# Patient Record
Sex: Female | Born: 1980 | Race: White | Hispanic: No | Marital: Married | Smoking: Never smoker
Health system: Southern US, Community
[De-identification: ages and names within clinical notes are randomized; demographics above are authoritative.]

## PROBLEM LIST (undated history)

## (undated) DIAGNOSIS — G35 Multiple sclerosis: Secondary | ICD-10-CM

## (undated) DIAGNOSIS — G35D Multiple sclerosis, unspecified: Secondary | ICD-10-CM

## (undated) DIAGNOSIS — G709 Myoneural disorder, unspecified: Secondary | ICD-10-CM

## (undated) HISTORY — DX: Multiple sclerosis: G35

## (undated) HISTORY — PX: FRACTURE SURGERY: SHX138

## (undated) HISTORY — DX: Multiple sclerosis, unspecified: G35.D

## (undated) HISTORY — PX: DILATION AND CURETTAGE OF UTERUS: SHX78

---

## 2006-06-12 ENCOUNTER — Ambulatory Visit: Payer: Self-pay | Admitting: Unknown Physician Specialty

## 2011-09-26 DIAGNOSIS — J302 Other seasonal allergic rhinitis: Secondary | ICD-10-CM | POA: Insufficient documentation

## 2016-01-03 DIAGNOSIS — M222X2 Patellofemoral disorders, left knee: Secondary | ICD-10-CM

## 2016-01-03 DIAGNOSIS — M222X1 Patellofemoral disorders, right knee: Secondary | ICD-10-CM | POA: Insufficient documentation

## 2016-01-03 DIAGNOSIS — S83232A Complex tear of medial meniscus, current injury, left knee, initial encounter: Secondary | ICD-10-CM | POA: Insufficient documentation

## 2016-01-17 DIAGNOSIS — S83412A Sprain of medial collateral ligament of left knee, initial encounter: Secondary | ICD-10-CM | POA: Insufficient documentation

## 2016-03-30 ENCOUNTER — Emergency Department
Admission: EM | Admit: 2016-03-30 | Discharge: 2016-03-30 | Disposition: A | Discharge status: Home / Self Care | Payer: 59 | Attending: Emergency Medicine | Admitting: Emergency Medicine

## 2016-03-30 DIAGNOSIS — Y9389 Activity, other specified: Secondary | ICD-10-CM | POA: Insufficient documentation

## 2016-03-30 DIAGNOSIS — S61451A Open bite of right hand, initial encounter: Secondary | ICD-10-CM

## 2016-03-30 DIAGNOSIS — Y929 Unspecified place or not applicable: Secondary | ICD-10-CM | POA: Insufficient documentation

## 2016-03-30 DIAGNOSIS — Y999 Unspecified external cause status: Secondary | ICD-10-CM | POA: Diagnosis not present

## 2016-03-30 DIAGNOSIS — W540XXA Bitten by dog, initial encounter: Secondary | ICD-10-CM | POA: Insufficient documentation

## 2016-03-30 MED ORDER — AMOXICILLIN-POT CLAVULANATE 875-125 MG PO TABS: 1.0000 | ORAL_TABLET | Freq: Two times a day (BID) | ORAL | Status: AC

## 2016-03-30 MED ORDER — OXYCODONE-ACETAMINOPHEN 7.5-325 MG PO TABS: 1.0000 | ORAL_TABLET | ORAL | Status: DC | PRN

## 2016-03-30 MED ORDER — AMOXICILLIN-POT CLAVULANATE 875-125 MG PO TABS
1.0000 | ORAL_TABLET | Freq: Once | ORAL | Status: AC
  Administered 2016-03-30: 1 via ORAL
  Filled 2016-03-30: qty 1

## 2016-03-30 MED ORDER — HYDROMORPHONE HCL 1 MG/ML IJ SOLN
1.0000 mg | Freq: Once | INTRAMUSCULAR | Status: AC
  Administered 2016-03-30: 1 mg via INTRAMUSCULAR
  Filled 2016-03-30: qty 1

## 2016-03-30 MED ORDER — IBUPROFEN 600 MG PO TABS
600.0000 mg | ORAL_TABLET | Freq: Once | ORAL | Status: AC
  Administered 2016-03-30: 600 mg via ORAL
  Filled 2016-03-30: qty 1

## 2016-03-30 MED ORDER — IBUPROFEN 600 MG PO TABS: 600.0000 mg | ORAL_TABLET | Freq: Three times a day (TID) | ORAL | Status: DC | PRN

## 2016-03-30 NOTE — Discharge Instructions (Signed)
Human Bite Human bite wounds can get infected very quickly. It is important to get medical treatment. HOME CARE : Wear sling for 2-3 days to decrease swelling. Wound Care  Follow instructions from your doctor about how to take care of your wound. Make sure you:  Wash your hands with soap and water before you change your bandage (dressing). If you cannot use soap and water, use hand sanitizer.  Change your bandage as told by your doctor.  Leave stitches (sutures), skin glue, or skin tape (adhesive) strips in place. They may need to stay in place for 2 weeks or longer. If tape strips get loose and curl up, you may trim the loose edges. Do not remove tape strips completely unless your doctor says it is okay.  Check your wound every day for signs of infection. Watch for:    Redness, swelling, or pain that gets worse.    Fluid, blood, or pus.  General Instructions  Take or apply over-the-counter and prescription medicines only as told by your doctor.  If you were given an antibiotic, take or apply it as told by your doctor. Do not stop using the antibiotic even if your condition improves.  Keep the injured area raised (elevated) above the level of your heart while you are sitting or lying down.  If directed, apply ice to the injured area:    Put ice in a plastic bag.    Place a towel between your skin and the bag.    Leave the ice on for 20 minutes, 2-3 times per day.   Keep all follow-up visits as told by your doctor. This is important.  GET HELP IF:  You have chills.   You have pain when you move your injured area.   You have trouble moving your injured area.   You are not getting better, or you are getting worse.  GET HELP RIGHT AWAY IF:   You have increasing fluid, blood, or pus coming from your wound.   You have increasing redness, swelling, or pain at the site of your wound.   You have a red streak going away from your wound.   You have a  fever.    This information is not intended to replace advice given to you by your health care provider. Make sure you discuss any questions you have with your health care provider.   Document Released: 04/02/2001 Document Revised: 06/28/2015 Document Reviewed: 02/22/2015 Elsevier Interactive Patient Education [employer]

## 2016-03-30 NOTE — ED Provider Notes (Signed)
[facility] Emergency Department Provider Note   ____________________________________________  Time seen: Approximately 9:32 PM  I have reviewed the triage vital signs and the nursing notes.   HISTORY  Chief Complaint Animal Bite    HPI Nancy Jennings is a 35 y.o. female patient with dog bite to the right hand and right shoulder. Incident occurred prior to arrival. Patient was helping a neighbor get her dog untangle in the animal bit her. Patient states she's been assured that the dog's immunizations are up-to-date. Animal control has been notified. Patient denies loss sensation or loss of function of the right upper extremity. Patient rates the pain as 8/10. Patient said her tetanus shot is up-to-date.   No past medical history on file.  There are no active problems to display for this patient.   No past surgical history on file.  Current Outpatient Rx  Name  Route  Sig  Dispense  Refill  . amoxicillin-clavulanate (AUGMENTIN) 875-125 MG tablet   Oral   Take 1 tablet by mouth 2 (two) times daily.   14 tablet   0   . ibuprofen (ADVIL,MOTRIN) 600 MG tablet   Oral   Take 1 tablet (600 mg total) by mouth every 8 (eight) hours as needed.   15 tablet   0   . oxyCODONE-acetaminophen (PERCOCET) 7.5-325 MG tablet   Oral   Take 1 tablet by mouth every 4 (four) hours as needed for severe pain.   20 tablet   0     Allergies Review of patient's allergies indicates no known allergies.  No family history on file.  Social History Social History  Substance Use Topics  . Smoking status: Not on file  . Smokeless tobacco: Not on file  . Alcohol Use: Not on file    Review of Systems Constitutional: No fever/chills Eyes: No visual changes. ENT: No sore throat. Cardiovascular: Denies chest pain. Respiratory: Denies shortness of breath. Gastrointestinal: No abdominal pain.  No nausea, no vomiting.  No diarrhea.  No  constipation. Genitourinary: Negative for dysuria. Musculoskeletal: Negative for back pain. Skin: Negative for rash. Dog bite to the right upper extremity Neurological: Negative for headaches, focal weakness or numbness.   ____________________________________________   PHYSICAL EXAM:  VITAL SIGNS: ED Triage Vitals  Enc Vitals Group     BP 03/30/16 2112 162/103 mmHg     Pulse Rate 03/30/16 2112 116     Resp 03/30/16 2112 24     Temp 03/30/16 2112 98.4 F (36.9 C)     Temp Source 03/30/16 2112 Oral     SpO2 03/30/16 2112 99 %     Weight 03/30/16 2112 285 lb (129.275 kg)     Height 03/30/16 2112 5\' 8"  (1.727 m)     Head Cir --      Peak Flow --      Pain Score 03/30/16 2113 8     Pain Loc --      Pain Edu? --      Excl. in GC? --     Constitutional: Alert and oriented. Well appearing and in no acute distress. Eyes: Conjunctivae are normal. PERRL. EOMI. Head: Atraumatic. Nose: No congestion/rhinnorhea. Mouth/Throat: Mucous membranes are moist.  Oropharynx non-erythematous. Neck: No stridor.  No cervical spine tenderness to palpation. Hematological/Lymphatic/Immunilogical: No cervical lymphadenopathy. Cardiovascular: Normal rate, regular rhythm. Grossly normal heart sounds.  Good peripheral circulation.Elevated blood pressure Respiratory: Normal respiratory effort.  No retractions. Lungs CTAB. Gastrointestinal: Soft and nontender. No distention. No  abdominal bruits. No CVA tenderness. Musculoskeletal: No lower extremity tenderness nor edema.  No joint effusions. Neurologic:  Normal speech and language. No gross focal neurologic deficits are appreciated. No gait instability. Skin:  Skin is warm, dry and intact. No rash noted. Psychiatric: Mood and affect are normal. Speech and behavior are normal.  ____________________________________________   LABS (all labs ordered are listed, but only abnormal results are displayed)  Labs Reviewed - No data to  display ____________________________________________  EKG   ____________________________________________  RADIOLOGY   ____________________________________________   PROCEDURES  Procedure(s) performed: None  Critical Care performed: No  ____________________________________________   INITIAL IMPRESSION / ASSESSMENT AND PLAN / ED COURSE  Pertinent labs & imaging results that were available during my care of the patient were reviewed by me and considered in my medical decision making (see chart for details).  Dog bite right upper extremity. Discussed with patient rationale for not closing the wounds. Patient given discharge care instructions. Patient given prescription for Augmentin, Percocet and ibuprofen. Patient advised with arm sling for 2-3 days to decrease swelling. Patient advised return by ER for condition worsens. ____________________________________________   FINAL CLINICAL IMPRESSION(S) / ED DIAGNOSES  Final diagnoses:  Dog bite of right hand, initial encounter      NEW MEDICATIONS STARTED DURING THIS VISIT:  New Prescriptions   AMOXICILLIN-CLAVULANATE (AUGMENTIN) 875-125 MG TABLET    Take 1 tablet by mouth 2 (two) times daily.   IBUPROFEN (ADVIL,MOTRIN) 600 MG TABLET    Take 1 tablet (600 mg total) by mouth every 8 (eight) hours as needed.   OXYCODONE-ACETAMINOPHEN (PERCOCET) 7.5-325 MG TABLET    Take 1 tablet by mouth every 4 (four) hours as needed for severe pain.     Note:  This document was prepared using Dragon voice recognition software and may include unintentional dictation errors.    Joni Reining, PA-C 03/30/16 2149  Joni Reining, PA-C 03/30/16 1610  Sharyn Creamer, MD 03/31/16 [phone]

## 2016-03-30 NOTE — ED Notes (Signed)
[place] PD Officer Paschal will notify [facility] regarding animal bite.

## 2016-03-30 NOTE — ED Notes (Signed)
Was helping neighbors dog get untangled and it bite her.  Patient with bite to right hand and right upper arm.

## 2018-02-18 HISTORY — PX: FRACTURE SURGERY: SHX138

## 2018-02-22 DIAGNOSIS — S8262XD Displaced fracture of lateral malleolus of left fibula, subsequent encounter for closed fracture with routine healing: Secondary | ICD-10-CM | POA: Insufficient documentation

## 2018-08-20 DIAGNOSIS — M79609 Pain in unspecified limb: Secondary | ICD-10-CM | POA: Insufficient documentation

## 2018-08-25 DIAGNOSIS — M79601 Pain in right arm: Secondary | ICD-10-CM | POA: Insufficient documentation

## 2018-08-25 DIAGNOSIS — M79642 Pain in left hand: Secondary | ICD-10-CM

## 2018-08-25 DIAGNOSIS — M79641 Pain in right hand: Secondary | ICD-10-CM | POA: Insufficient documentation

## 2018-08-25 DIAGNOSIS — M79602 Pain in left arm: Secondary | ICD-10-CM

## 2018-09-30 DIAGNOSIS — R200 Anesthesia of skin: Secondary | ICD-10-CM | POA: Insufficient documentation

## 2018-09-30 DIAGNOSIS — R202 Paresthesia of skin: Secondary | ICD-10-CM

## 2018-10-22 ENCOUNTER — Other Ambulatory Visit: Payer: Self-pay | Admitting: Neurology

## 2018-10-22 DIAGNOSIS — R9089 Other abnormal findings on diagnostic imaging of central nervous system: Secondary | ICD-10-CM

## 2018-10-22 DIAGNOSIS — G35 Multiple sclerosis: Secondary | ICD-10-CM

## 2018-10-28 ENCOUNTER — Ambulatory Visit
Admission: RE | Admit: 2018-10-28 | Discharge: 2018-10-28 | Disposition: A | Discharge status: Home / Self Care | Payer: 59 | Source: Ambulatory Visit | Attending: Neurology | Admitting: Neurology

## 2018-10-28 DIAGNOSIS — G35 Multiple sclerosis: Secondary | ICD-10-CM | POA: Insufficient documentation

## 2018-10-28 DIAGNOSIS — R9089 Other abnormal findings on diagnostic imaging of central nervous system: Secondary | ICD-10-CM | POA: Diagnosis not present

## 2018-10-28 HISTORY — DX: Myoneural disorder, unspecified: G70.9

## 2018-10-28 LAB — CBC WITH DIFFERENTIAL/PLATELET
ABS IMMATURE GRANULOCYTES: 0.01 10*3/uL (ref 0.00–0.07)
BASOS PCT: 1 %
Basophils Absolute: 0 10*3/uL (ref 0.0–0.1)
Eosinophils Absolute: 0.2 10*3/uL (ref 0.0–0.5)
Eosinophils Relative: 3 %
HEMATOCRIT: 44.2 % (ref 36.0–46.0)
HEMOGLOBIN: 13.7 g/dL (ref 12.0–15.0)
Immature Granulocytes: 0 %
Lymphocytes Relative: 29 %
Lymphs Abs: 1.4 10*3/uL (ref 0.7–4.0)
MCH: 25.2 pg — AB (ref 26.0–34.0)
MCHC: 31 g/dL (ref 30.0–36.0)
MCV: 81.4 fL (ref 80.0–100.0)
MONO ABS: 0.4 10*3/uL (ref 0.1–1.0)
MONOS PCT: 8 %
NRBC: 0 % (ref 0.0–0.2)
Neutro Abs: 2.9 10*3/uL (ref 1.7–7.7)
Neutrophils Relative %: 59 %
Platelets: 256 10*3/uL (ref 150–400)
RBC: 5.43 MIL/uL — ABNORMAL HIGH (ref 3.87–5.11)
RDW: 13.9 % (ref 11.5–15.5)
WBC: 4.8 10*3/uL (ref 4.0–10.5)

## 2018-10-28 LAB — CSF CELL COUNT WITH DIFFERENTIAL
Eosinophils, CSF: 0 %
Lymphs, CSF: 90 %
Monocyte-Macrophage-Spinal Fluid: 0 %
OTHER CELLS CSF: 0
RBC COUNT CSF: 322 /mm3 — AB (ref 0–3)
Segmented Neutrophils-CSF: 10 %
TUBE #: 1
WBC, CSF: 2 /mm3 (ref 0–5)

## 2018-10-28 LAB — GLUCOSE, CSF: Glucose, CSF: 59 mg/dL (ref 40–70)

## 2018-10-28 LAB — POCT URINE PREGNANCY: Preg Test, Ur: NEGATIVE

## 2018-10-28 LAB — PROTEIN, CSF: Total  Protein, CSF: 22 mg/dL (ref 15–45)

## 2018-10-28 LAB — POCT PREGNANCY, URINE: PREG TEST UR: NEGATIVE

## 2018-10-28 MED ORDER — ACETAMINOPHEN 500 MG PO TABS
1000.0000 mg | ORAL_TABLET | Freq: Four times a day (QID) | ORAL | Status: DC | PRN
  Administered 2018-10-28: 1000 mg via ORAL
  Filled 2018-10-28 (×2): qty 2

## 2018-10-28 MED ORDER — ACETAMINOPHEN 500 MG PO TABS
ORAL_TABLET | ORAL | Status: AC
  Administered 2018-10-28: 1000 mg via ORAL
  Filled 2018-10-28: qty 2

## 2018-10-28 NOTE — OR Nursing (Signed)
Negative pregnancy test 10/28/2018.

## 2018-10-31 LAB — HSV(HERPES SMPLX VRS)ABS-I+II(IGG)-CSF: HSV Type I/II Ab, IgG CSF: 0.9 IV — ABNORMAL HIGH (ref <=0.89)

## 2018-11-01 LAB — CSF CULTURE: GRAM STAIN: NONE SEEN

## 2018-11-01 LAB — CSF CULTURE W GRAM STAIN: Culture: NO GROWTH

## 2018-11-02 LAB — OLIGOCLONAL BANDS, CSF + SERM

## 2019-01-20 ENCOUNTER — Encounter: Payer: Self-pay | Admitting: Neurology

## 2019-01-26 ENCOUNTER — Other Ambulatory Visit: Payer: Self-pay | Admitting: Hematology and Oncology

## 2019-01-26 ENCOUNTER — Other Ambulatory Visit: Payer: Self-pay

## 2019-01-26 DIAGNOSIS — G35 Multiple sclerosis: Secondary | ICD-10-CM | POA: Insufficient documentation

## 2019-01-26 NOTE — Progress Notes (Signed)
[facility]  [address], [address] [place], [place] [address] Phone: [phone]  Fax: [phone]   Clinic day:  01/27/2019  Chief Complaint: Nancy ButteryJennifer L Jennings is a 38 y.o. female with multiple sclerosis who is referred by Dr. Malvin JohnsPotter for assessment and initiation of Tysabri.  HPI:   The patient was initially seen by Dr Malvin JohnsPotter on 08/18/2018 with bilateral arm pain and tingling.  Symptoms were primarily in the left arm, third and fourth digit and shoulder.  EMG mild left carpal tunnel syndrome.  Lumbar puncture on 10/28/2018 revealed 15 oligoclonal bands.  Cervical spine MRI on 10/30/2018 revealed findings consistent with a demyelinating disease.  Head MRI on 10/30/2018 was consistent with a demyelinating disease.  There were > 10  T2 flair hyperintense white matter lesions in both cerebral hemispheres, both periventricular and juxtacortical areas, as well as in both cerebellar hemispheres.  Laboratory testing on 11/09/2018 was notable for hematocrit of 43.2, hemoglobin 13.7, MCV 81.2, platelets 280,000, white count 5600 with an ANC of 3480.  Creatinine was 0.8.  Liver function tests were normal.  JC virus antibody was 0.26 (0.20-0.40; indeterminate) on 11/09/2018.  Reflex inhibition assay was negative.  Hepatitis C antibody, hepatitis B surface antibody and hepatitis B core antibody total were negative.  QuantiFERON TB gold was negative.  Patient was felt to have moderate multiple sclerosis.  Treatment options considered included Tysabri or Rituxan to control her demyelinating disease.  After completing labs, decision was made to proceed with Tysabri every 6 weeks.   Symptomatically, she notes some lingering pain in her left arm.  She denies any fevers, sweats or weight loss.  She has an appointment with Dr. Malvin JohnsPotter on 02/10/2019.   Past Medical History:  Diagnosis Date  . Neuromuscular disorder [facility](HCC)     Past Surgical History:  Procedure Laterality Date  .  DILATION AND CURETTAGE OF UTERUS    . FRACTURE SURGERY      History reviewed. No pertinent family history.  Social History:  reports that she has never smoked. She has never used smokeless tobacco. She reports current alcohol use of about 1.0 standard drinks of alcohol per week. She reports that she does not use drugs.  No exposure to radiation or toxins.  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  The patient lives in [place].  The patient is alone today.  Allergies: No Known Allergies  Current Medications: Current Outpatient Medications  Medication Sig Dispense Refill  . Cholecalciferol (VITAMIN D3) 50 MCG (2000 UT) capsule Take by mouth.    . gabapentin (NEURONTIN) 100 MG capsule Take 100 mg  1-3 times daily    . Loratadine 10 MG CAPS Take by mouth.    . medroxyPROGESTERone (DEPO-PROVERA) 150 MG/ML injection Inject into the muscle.    . ibuprofen (ADVIL,MOTRIN) 600 MG tablet Take 1 tablet (600 mg total) by mouth every 8 (eight) hours as needed. (Patient not taking: Reported on 01/27/2019) 15 tablet 0  . oxyCODONE-acetaminophen (PERCOCET) 7.5-325 MG tablet Take 1 tablet by mouth every 4 (four) hours as needed for severe pain. (Patient not taking: Reported on 01/27/2019) 20 tablet 0   No current facility-administered medications for this visit.     Review of Systems:  GENERAL:  Feels good.  No fevers, sweats or weight loss. PERFORMANCE STATUS (ECOG):  1 HEENT:  No visual changes, runny nose, sore throat, mouth sores or tenderness. Lungs: No shortness of breath or cough.  No hemoptysis. Cardiac:  No chest pain,  palpitations, orthopnea, or PND. GI:  No nausea, vomiting, diarrhea, constipation, melena or hematochezia. GU:  No urgency, frequency, dysuria, or hematuria. Musculoskeletal:  Lingering pain in left arm.  No back pain.  No joint pain.  No muscle tenderness. Extremities:  No pain or swelling. Skin:  No rashes or skin changes. Neuro:  Sinus related headache.  No headache,  numbness or weakness, balance or coordination issues. Endocrine:  No diabetes, thyroid issues, hot flashes or night sweats. Psych:  No mood changes, depression or anxiety. Pain:  No focal pain. Review of systems:  All other systems reviewed and found to be negative.  Physical Exam: Blood pressure (!) 125/91, pulse 94, temperature 98 F (36.7 C), temperature source Tympanic, resp. rate 18, height  (1.727 m), weight (!) 302 lb 5.8 oz (137.1 kg), SpO2 100 %. GENERAL:  Well developed, well nourished, heavyset woman sitting comfortably in the exam room in no acute distress. MENTAL STATUS:  Alert and oriented to person, place and time. HEAD:  Long dark hair with light brown highlights.  Normocephalic, atraumatic, face symmetric, no Cushingoid features. EYES:  Glasses.  Brown eyes.  Pupils equal round and reactive to light and accomodation.  No conjunctivitis or scleral icterus. ENT:  Oropharynx clear without lesion.  Tongue normal. Mucous membranes moist.  RESPIRATORY:  Clear to auscultation without rales, wheezes or rhonchi. CARDIOVASCULAR:  Regular rate and rhythm without murmur, rub or gallop. ABDOMEN:  Soft, non-tender, with active bowel sounds, and no hepatosplenomegaly.  No masses. SKIN:  No rashes, ulcers or lesions. EXTREMITIES: No edema, no skin discoloration or tenderness.  No palpable cords. LYMPH NODES: No palpable cervical, supraclavicular, axillary or inguinal adenopathy  NEUROLOGICAL: Unremarkable. PSYCH:  Appropriate.   No visits with results within 3 Day(s) from this visit.  Latest known visit with results is:  Hospital Outpatient Visit on 10/28/2018  Component Date Value Ref Range Status  . WBC 10/28/2018 4.8  4.0 - 10.5 K/uL Final  . RBC 10/28/2018 5.43* 3.87 - 5.11 MIL/uL Final  . Hemoglobin 10/28/2018 13.7  12.0 - 15.0 g/dL Final  . HCT 16/07/9603 44.2  36.0 - 46.0 % Final  . MCV 10/28/2018 81.4  80.0 - 100.0 fL Final  . MCH 10/28/2018 25.2* 26.0 - 34.0 pg Final   . MCHC 10/28/2018 31.0  30.0 - 36.0 g/dL Final  . RDW 54/06/8118 13.9  11.5 - 15.5 % Final  . Platelets 10/28/2018 256  150 - 400 K/uL Final  . nRBC 10/28/2018 0.0  0.0 - 0.2 % Final  . Neutrophils Relative % 10/28/2018 59  % Final  . Neutro Abs 10/28/2018 2.9  1.7 - 7.7 K/uL Final  . Lymphocytes Relative 10/28/2018 29  % Final  . Lymphs Abs 10/28/2018 1.4  0.7 - 4.0 K/uL Final  . Monocytes Relative 10/28/2018 8  % Final  . Monocytes Absolute 10/28/2018 0.4  0.1 - 1.0 K/uL Final  . Eosinophils Relative 10/28/2018 3  % Final  . Eosinophils Absolute 10/28/2018 0.2  0.0 - 0.5 K/uL Final  . Basophils Relative 10/28/2018 1  % Final  . Basophils Absolute 10/28/2018 0.0  0.0 - 0.1 K/uL Final  . Immature Granulocytes 10/28/2018 0  % Final  . Abs Immature Granulocytes 10/28/2018 0.01  0.00 - 0.07 K/uL Final   Performed at [facility], [address], [place], [place] [address]  . Preg Test, Ur 10/28/2018 Negative  Negative Final  . Glucose, CSF 10/28/2018 59  40 - 70 mg/dL Final  Performed at [facility], [address], [place], [place] [address]  . Total  Protein, CSF 10/28/2018 22  15 - 45 mg/dL Final   Performed at [facility], [address] [place] [place], [place] [address]  . Tube # 10/28/2018 1   Final  . Color, CSF 10/28/2018 COLORLESS  COLORLESS Final  . Appearance, CSF 10/28/2018 CLEAR* CLEAR Final  . RBC Count, CSF 10/28/2018 322* 0 - 3 /cu mm Final  . WBC, CSF 10/28/2018 2  0 - 5 /cu mm Final  . Segmented Neutrophils-CSF 10/28/2018 10  % Final  . Lymphs, CSF 10/28/2018 90  % Final  . Monocyte-Macrophage-Spinal Fluid 10/28/2018 0  % Final  . Eosinophils, CSF 10/28/2018 0  % Final  . Other Cells, CSF 10/28/2018 0   Final   Performed at [facility], [address] [place], [place] [address]  . Specimen Description 10/28/2018    Final                   Value:CSF Performed at [facility], [address] [place], [place] [address]    . Special Requests 10/28/2018    Final                   Value:NONE Performed at [facility], [address] [place], [place] [address]   . Gram Stain 10/28/2018    Final                   Value:NO ORGANISMS SEEN WBC SEEN RED BLOOD CELLS Performed at [facility], [address] [place], [place] [address]   . Culture 10/28/2018    Final                   Value:NO GROWTH 3 DAYS Performed at [facility], [address], [place], [place] [address]   . Report Status 10/28/2018 11/01/2018 FINAL   Final  . HSV Type I/II Ab, IgG CSF 10/28/2018 0.90* <=0.89 IV Final   Comment: (NOTE) The CSF specimen shows evidence of blood contamination and is, therefore, likely contaminated with serum antibodies. Antibody testing from this specimen is not recommended, as the blood may interfere, causing a false-positive result that does not represent intrathecally produced antibodies. False-negative results are also possible. It is highly recommended that serum testing for the same analyte also be performed in order to aid in interpreting the CSF test result. INTERPRETIVE INFORMATION: Herpes Simplex Virus Type 1 and/or 2                    Antibodies, IgG CSF  0.89 IV or Less .......... Negative: No significant                             level of detectable HSV IgG                             antibody.  0.90 - 1.09 IV ........... Equivocal: Questionable                             presence of IgG antibodies.                             Repeat testing in 10-14 days  may be helpful.  1.10 IV or Greater ....... Positive: IgG antib                          ody to HSV                             detected, which may indicate                             a current or past HSV                             infection. The detection of antibodies to herpes simplex virus in CSF may indicate central nervous system infection. However, consideration must  be given to possible contamination by blood or transfer of serum antibodies across the blood-brain barrier. Fourfold or greater rise in CSF antibodies to herpes on specimens at least 4 weeks apart are found in 74-94 % of patients with herpes encephalitis. Specificity of the test based on a single CSF testing is not established. Presently PCR is the primary means of establishing a diagnosis of herpes encephalitis. Test developed and characteristics determined by [facility]. See Compliance Statement B: [email or web address] Performed At: [facility] [address] [place], [place] [id number] Donivan Scull MD [phone]   . Preg Test, Ur 10/28/2018 NEGATIVE  NEGATIVE Final   Comment:        THE SENSITIVITY OF THIS METHODOLOGY IS >24 mIU/mL   . CSF Oligoclonal Bands 10/28/2018 Comment   Final   Comment: (NOTE) Greater than twelve (>12) oligoclonal bands were observed in the CSF, which were not detected in the serum sample. Specimen was hemolyzed. Result may be adversely affected. Interpretation: Criteria for Positivity: Four (4) or more oligoclonal bands observed  only in the CSF have been shown to be most consistent with MS  using our method. [Fortini AS, Sanders EL, Weinshenker BG, and Katzmann JA: Cerebrospinal Fluid Oligoclonal Bands in the Diagnosis  of Multiple Sclerosis. Am J Clin Pathol 120(5):672-675, 2003]. Oligoclonal bands that are present only in the CSF have been associated with a variety of inflammatory brain diseases such as multiple sclerosis (MS), subacute encephalitis, neurosyphilis, etc.  Increased IgG in the CSF is not specific for MS, but is an indication  of chronic neural inflammation. Clinical correlation indicated. Approximately 2-3% of clinically confirmed MS patients show little or no evidence of oligoclonal bands in the CSF; however olig                          oclonal  bands may develop as the disease progresses. Oligoclonal Banding  testing performed using Isoelectric Focusing (IEF) and immunoblotting methodology. Performed At: [facility] [address] [place], [place] [id number] Jolene Schimke MD VH:[id number]     Assessment:  Nancy Jennings is a 38 y.o. female with multiple sclerosis.  She presented with bilateral arm pain and tingling.  Lumbar puncture on 10/28/2018 revealed 15 oligoclonal bands.  Cervical spine MRI on 10/30/2018 revealed findings consistent with a demyelinating disease.  Head MRI on 10/30/2018 was consistent with a demyelinating disease.  There were > 10  T2 flair hyperintense white matter lesions in both cerebral hemispheres, both periventricular and juxtacortical areas, as well as in both cerebellar hemispheres.  JC  virus antibody was 0.26 (0.20-0.40; indeterminate) on 11/09/2018.  Reflex inhibition assay was negative.  Hepatitis C antibody, hepatitis B surface antibody and hepatitis B core antibody total were negative.  QuantiFERON TB gold was negative.  Symptomatically, she has residual left upper extremity symptoms.  Exam is unremarkable.  Plan: 1.   Review entire medical history, diagnosis and management of multiple sclerosis. 2.   Multiple sclerosis  Discuss plan for Tysabri.  Potential side effects reviewed:     Allergic reaction, increase in LFTs, infection (including herpes), and progressive multifocal leukoencephalopathy (PML).  Discuss testing of JC virus every 6 months with neurology.  Discuss role of the infusion center.  Discuss follow-up with Dr. [name] on 02/10/2019.  Tysabri 300 mg IV today.  Patient consented to treatment. 3.   RTC every 6 weeks for Tysabri. 4.   RTC in 24 weeks for  MD assessment and Fatima Blank, MD, PhD  01/27/2019, 10:42 AM

## 2019-01-27 ENCOUNTER — Inpatient Hospital Stay: Payer: 59

## 2019-01-27 ENCOUNTER — Encounter: Payer: Self-pay | Admitting: Hematology and Oncology

## 2019-01-27 ENCOUNTER — Inpatient Hospital Stay: Payer: 59 | Attending: Hematology and Oncology | Admitting: Hematology and Oncology

## 2019-01-27 VITALS — BP 118/77 | HR 85 | Resp 18

## 2019-01-27 VITALS — BP 125/91 | HR 94 | Temp 98.0°F | Resp 18 | Ht 68.0 in | Wt 302.4 lb

## 2019-01-27 DIAGNOSIS — M79602 Pain in left arm: Secondary | ICD-10-CM | POA: Diagnosis not present

## 2019-01-27 DIAGNOSIS — G35 Multiple sclerosis: Secondary | ICD-10-CM | POA: Diagnosis present

## 2019-01-27 DIAGNOSIS — Z5112 Encounter for antineoplastic immunotherapy: Secondary | ICD-10-CM | POA: Insufficient documentation

## 2019-01-27 DIAGNOSIS — E669 Obesity, unspecified: Secondary | ICD-10-CM | POA: Insufficient documentation

## 2019-01-27 DIAGNOSIS — R7302 Impaired glucose tolerance (oral): Secondary | ICD-10-CM | POA: Insufficient documentation

## 2019-01-27 DIAGNOSIS — E785 Hyperlipidemia, unspecified: Secondary | ICD-10-CM | POA: Insufficient documentation

## 2019-01-27 MED ORDER — SODIUM CHLORIDE 0.9 % IV SOLN
300.0000 mg | Freq: Once | INTRAVENOUS | Status: AC
  Administered 2019-01-27: 300 mg via INTRAVENOUS
  Filled 2019-01-27: qty 15

## 2019-01-27 MED ORDER — SODIUM CHLORIDE 0.9 % IV SOLN
Freq: Once | INTRAVENOUS | Status: AC
  Administered 2019-01-27: 11:00:00 via INTRAVENOUS
  Filled 2019-01-27: qty 250

## 2019-01-27 MED ORDER — ACETAMINOPHEN 500 MG PO TABS
1000.0000 mg | ORAL_TABLET | Freq: Once | ORAL | Status: AC
  Administered 2019-01-27: 1000 mg via ORAL

## 2019-01-27 NOTE — Patient Instructions (Signed)
Natalizumab injection What is this medicine? NATALIZUMAB (na ta LIZ you mab) is used to treat relapsing multiple sclerosis. This drug is not a cure. It is also used to treat Crohn's disease. This medicine may be used for other purposes; ask your health care provider or pharmacist if you have questions. COMMON BRAND NAME(S): Tysabri What should I tell my health care provider before I take this medicine? They need to know if you have any of these conditions: -immune system problems -progressive multifocal leukoencephalopathy (PML) -an unusual or allergic reaction to natalizumab, other medicines, foods, dyes, or preservatives -pregnant or trying to get pregnant -breast-feeding How should I use this medicine? This medicine is for infusion into a vein. It is given by a health care professional in a hospital or clinic setting. A special MedGuide will be given to you by the pharmacist with each prescription and refill. Be sure to read this information carefully each time. Talk to your pediatrician regarding the use of this medicine in children. This medicine is not approved for use in children. Overdosage: If you think you have taken too much of this medicine contact a poison control center or emergency room at once. NOTE: This medicine is only for you. Do not share this medicine with others. What if I miss a dose? It is important not to miss your dose. Call your doctor or health care professional if you are unable to keep an appointment. What may interact with this medicine? -azathioprine -cyclosporine -interferon -6-mercaptopurine -methotrexate -steroid medicines like prednisone or cortisone -TNF-alpha inhibitors like adalimumab, etanercept, and infliximab -vaccines This list may not describe all possible interactions. Give your health care provider a list of all the medicines, herbs, non-prescription drugs, or dietary supplements you use. Also tell them if you smoke, drink alcohol, or use  illegal drugs. Some items may interact with your medicine. What should I watch for while using this medicine? Your condition will be monitored carefully while you are receiving this medicine. Visit your doctor for regular check ups. Tell your doctor or healthcare professional if your symptoms do not start to get better or if they get worse. Stay away from people who are sick. Call your doctor or health care professional for advice if you get a fever, chills or sore throat, or other symptoms of a cold or flu. Do not treat yourself. In some patients, this medicine may cause a serious brain infection that may cause death. If you have any problems seeing, thinking, speaking, walking, or standing, tell your doctor right away. If you cannot reach your doctor, get urgent medical care. What side effects may I notice from receiving this medicine? Side effects that you should report to your doctor or health care professional as soon as possible: -allergic reactions like skin rash, itching or hives, swelling of the face, lips, or tongue -breathing problems -changes in vision -chest pain -dark urine -depression, feelings of sadness -dizziness -general ill feeling or flu-like symptoms -irregular, missed, or painful menstrual periods -light-colored stools -loss of appetite, nausea -muscle weakness -problems with balance, talking, or walking -right upper belly pain -unusually weak or tired -yellowing of the eyes or skin Side effects that usually do not require medical attention (report to your doctor or health care professional if they continue or are bothersome): -aches, pains -headache -stomach upset -tiredness This list may not describe all possible side effects. Call your doctor for medical advice about side effects. You may report side effects to FDA at 1-800-FDA-1088. Where should I keep   my medicine? This drug is given in a hospital or clinic and will not be stored at home. NOTE: This sheet is  a summary. It may not cover all possible information. If you have questions about this medicine, talk to your doctor, pharmacist, or health care provider.  2019 Elsevier/Gold Standard (2008-11-26 13:33:21)  

## 2019-03-10 ENCOUNTER — Other Ambulatory Visit: Payer: Self-pay

## 2019-03-10 ENCOUNTER — Inpatient Hospital Stay: Payer: 59 | Attending: Hematology and Oncology

## 2019-03-10 VITALS — BP 121/76 | HR 80 | Temp 97.1°F | Resp 18

## 2019-03-10 DIAGNOSIS — G35 Multiple sclerosis: Secondary | ICD-10-CM | POA: Insufficient documentation

## 2019-03-10 MED ORDER — ACETAMINOPHEN 500 MG PO TABS
1000.0000 mg | ORAL_TABLET | Freq: Once | ORAL | Status: AC
  Administered 2019-03-10: 1000 mg via ORAL
  Filled 2019-03-10: qty 2

## 2019-03-10 MED ORDER — SODIUM CHLORIDE 0.9 % IV SOLN
300.0000 mg | Freq: Once | INTRAVENOUS | Status: AC
  Administered 2019-03-10: 300 mg via INTRAVENOUS
  Filled 2019-03-10: qty 15

## 2019-03-10 MED ORDER — SODIUM CHLORIDE 0.9 % IV SOLN
Freq: Once | INTRAVENOUS | Status: AC
  Administered 2019-03-10: 10:00:00 via INTRAVENOUS
  Filled 2019-03-10: qty 250

## 2019-03-10 NOTE — Patient Instructions (Signed)
Natalizumab injection What is this medicine? NATALIZUMAB (na ta LIZ you mab) is used to treat relapsing multiple sclerosis. This drug is not a cure. It is also used to treat Crohn's disease. This medicine may be used for other purposes; ask your health care provider or pharmacist if you have questions. COMMON BRAND NAME(S): Tysabri What should I tell my health care provider before I take this medicine? They need to know if you have any of these conditions: -immune system problems -progressive multifocal leukoencephalopathy (PML) -an unusual or allergic reaction to natalizumab, other medicines, foods, dyes, or preservatives -pregnant or trying to get pregnant -breast-feeding How should I use this medicine? This medicine is for infusion into a vein. It is given by a health care professional in a hospital or clinic setting. A special MedGuide will be given to you by the pharmacist with each prescription and refill. Be sure to read this information carefully each time. Talk to your pediatrician regarding the use of this medicine in children. This medicine is not approved for use in children. Overdosage: If you think you have taken too much of this medicine contact a poison control center or emergency room at once. NOTE: This medicine is only for you. Do not share this medicine with others. What if I miss a dose? It is important not to miss your dose. Call your doctor or health care professional if you are unable to keep an appointment. What may interact with this medicine? -azathioprine -cyclosporine -interferon -6-mercaptopurine -methotrexate -steroid medicines like prednisone or cortisone -TNF-alpha inhibitors like adalimumab, etanercept, and infliximab -vaccines This list may not describe all possible interactions. Give your health care provider a list of all the medicines, herbs, non-prescription drugs, or dietary supplements you use. Also tell them if you smoke, drink alcohol, or use  illegal drugs. Some items may interact with your medicine. What should I watch for while using this medicine? Your condition will be monitored carefully while you are receiving this medicine. Visit your doctor for regular check ups. Tell your doctor or healthcare professional if your symptoms do not start to get better or if they get worse. Stay away from people who are sick. Call your doctor or health care professional for advice if you get a fever, chills or sore throat, or other symptoms of a cold or flu. Do not treat yourself. In some patients, this medicine may cause a serious brain infection that may cause death. If you have any problems seeing, thinking, speaking, walking, or standing, tell your doctor right away. If you cannot reach your doctor, get urgent medical care. What side effects may I notice from receiving this medicine? Side effects that you should report to your doctor or health care professional as soon as possible: -allergic reactions like skin rash, itching or hives, swelling of the face, lips, or tongue -breathing problems -changes in vision -chest pain -dark urine -depression, feelings of sadness -dizziness -general ill feeling or flu-like symptoms -irregular, missed, or painful menstrual periods -light-colored stools -loss of appetite, nausea -muscle weakness -problems with balance, talking, or walking -right upper belly pain -unusually weak or tired -yellowing of the eyes or skin Side effects that usually do not require medical attention (report to your doctor or health care professional if they continue or are bothersome): -aches, pains -headache -stomach upset -tiredness This list may not describe all possible side effects. Call your doctor for medical advice about side effects. You may report side effects to FDA at 1-800-FDA-1088. Where should I keep   my medicine? This drug is given in a hospital or clinic and will not be stored at home. NOTE: This sheet is  a summary. It may not cover all possible information. If you have questions about this medicine, talk to your doctor, pharmacist, or health care provider.  2019 Elsevier/Gold Standard (2008-11-26 13:33:21)  

## 2019-04-06 ENCOUNTER — Other Ambulatory Visit: Payer: Self-pay | Admitting: Certified Nurse Midwife

## 2019-04-06 DIAGNOSIS — Z1231 Encounter for screening mammogram for malignant neoplasm of breast: Secondary | ICD-10-CM

## 2019-04-21 ENCOUNTER — Inpatient Hospital Stay: Payer: 59 | Attending: Hematology and Oncology

## 2019-04-21 ENCOUNTER — Other Ambulatory Visit: Payer: Self-pay

## 2019-04-21 VITALS — BP 117/80 | HR 83 | Temp 98.6°F | Resp 18

## 2019-04-21 DIAGNOSIS — G35 Multiple sclerosis: Secondary | ICD-10-CM | POA: Diagnosis present

## 2019-04-21 MED ORDER — ACETAMINOPHEN 500 MG PO TABS
1000.0000 mg | ORAL_TABLET | Freq: Once | ORAL | Status: AC
  Administered 2019-04-21: 1000 mg via ORAL

## 2019-04-21 MED ORDER — SODIUM CHLORIDE 0.9 % IV SOLN
300.0000 mg | Freq: Once | INTRAVENOUS | Status: AC
  Administered 2019-04-21: 300 mg via INTRAVENOUS
  Filled 2019-04-21: qty 15

## 2019-04-21 MED ORDER — SODIUM CHLORIDE 0.9 % IV SOLN
Freq: Once | INTRAVENOUS | Status: AC
  Administered 2019-04-21: 10:00:00 via INTRAVENOUS
  Filled 2019-04-21: qty 250

## 2019-04-21 NOTE — Patient Instructions (Signed)
Natalizumab injection What is this medicine? NATALIZUMAB (na ta LIZ you mab) is used to treat relapsing multiple sclerosis. This drug is not a cure. It is also used to treat Crohn's disease. This medicine may be used for other purposes; ask your health care provider or pharmacist if you have questions. COMMON BRAND NAME(S): Tysabri What should I tell my health care provider before I take this medicine? They need to know if you have any of these conditions:  immune system problems  progressive multifocal leukoencephalopathy (PML)  an unusual or allergic reaction to natalizumab, other medicines, foods, dyes, or preservatives  pregnant or trying to get pregnant  breast-feeding How should I use this medicine? This medicine is for infusion into a vein. It is given by a health care professional in a hospital or clinic setting. A special MedGuide will be given to you by the pharmacist with each prescription and refill. Be sure to read this information carefully each time. Talk to your pediatrician regarding the use of this medicine in children. This medicine is not approved for use in children. Overdosage: If you think you have taken too much of this medicine contact a poison control center or emergency room at once. NOTE: This medicine is only for you. Do not share this medicine with others. What if I miss a dose? It is important not to miss your dose. Call your doctor or health care professional if you are unable to keep an appointment. What may interact with this medicine?  azathioprine  cyclosporine  interferon  6-mercaptopurine  methotrexate  steroid medicines like prednisone or cortisone  TNF-alpha inhibitors like adalimumab, etanercept, and infliximab  vaccines This list may not describe all possible interactions. Give your health care provider a list of all the medicines, herbs, non-prescription drugs, or dietary supplements you use. Also tell them if you smoke, drink  alcohol, or use illegal drugs. Some items may interact with your medicine. What should I watch for while using this medicine? Your condition will be monitored carefully while you are receiving this medicine. Visit your doctor for regular check ups. Tell your doctor or healthcare professional if your symptoms do not start to get better or if they get worse. Stay away from people who are sick. Call your doctor or health care professional for advice if you get a fever, chills or sore throat, or other symptoms of a cold or flu. Do not treat yourself. In some patients, this medicine may cause a serious brain infection that may cause death. If you have any problems seeing, thinking, speaking, walking, or standing, tell your doctor right away. If you cannot reach your doctor, get urgent medical care. What side effects may I notice from receiving this medicine? Side effects that you should report to your doctor or health care professional as soon as possible:  allergic reactions like skin rash, itching or hives, swelling of the face, lips, or tongue  breathing problems  changes in vision  chest pain  dark urine  depression, feelings of sadness  dizziness  general ill feeling or flu-like symptoms  irregular, missed, or painful menstrual periods  light-colored stools  loss of appetite, nausea  muscle weakness  problems with balance, talking, or walking  right upper belly pain  unusually weak or tired  yellowing of the eyes or skin Side effects that usually do not require medical attention (report to your doctor or health care professional if they continue or are bothersome):  aches, pains  headache  stomach upset    tiredness This list may not describe all possible side effects. Call your doctor for medical advice about side effects. You may report side effects to FDA at 1-800-FDA-1088. Where should I keep my medicine? This drug is given in a hospital or clinic and will not be  stored at home. NOTE: This sheet is a summary. It may not cover all possible information. If you have questions about this medicine, talk to your doctor, pharmacist, or health care provider.  2020 Elsevier/Gold Standard (2008-11-26 13:33:21)  

## 2019-05-14 ENCOUNTER — Ambulatory Visit
Admission: RE | Admit: 2019-05-14 | Discharge: 2019-05-14 | Disposition: A | Discharge status: Home / Self Care | Payer: 59 | Source: Ambulatory Visit | Attending: Certified Nurse Midwife | Admitting: Certified Nurse Midwife

## 2019-05-14 DIAGNOSIS — Z1231 Encounter for screening mammogram for malignant neoplasm of breast: Secondary | ICD-10-CM | POA: Insufficient documentation

## 2019-06-02 ENCOUNTER — Inpatient Hospital Stay: Payer: 59

## 2019-06-09 ENCOUNTER — Encounter: Payer: Self-pay | Admitting: Neurology

## 2019-06-09 ENCOUNTER — Ambulatory Visit (INDEPENDENT_AMBULATORY_CARE_PROVIDER_SITE_OTHER): Payer: 59 | Admitting: Neurology

## 2019-06-09 ENCOUNTER — Other Ambulatory Visit: Payer: Self-pay

## 2019-06-09 VITALS — BP 139/96 | HR 100 | Temp 98.2°F | Ht 68.0 in | Wt 312.5 lb

## 2019-06-09 DIAGNOSIS — R202 Paresthesia of skin: Secondary | ICD-10-CM

## 2019-06-09 DIAGNOSIS — R200 Anesthesia of skin: Secondary | ICD-10-CM | POA: Diagnosis not present

## 2019-06-09 DIAGNOSIS — G4700 Insomnia, unspecified: Secondary | ICD-10-CM

## 2019-06-09 DIAGNOSIS — G35 Multiple sclerosis: Secondary | ICD-10-CM

## 2019-06-09 NOTE — Progress Notes (Addendum)
[facility]  PATIENT: Nancy Jennings DOB: 26-Aug-1981  REFERRING DOCTOR OR PCP:  Nancy Mould, NP (PCP); Dr. Malvin Jennings (Neurology) SOURCE: Patient, notes from Dr. Malvin Jennings, imaging and laboratory reports,  _________________________________   HISTORICAL  CHIEF COMPLAINT:  Chief Complaint  Patient presents with  . New Patient (Initial Visit)    RM 12, alone. Paper referral for MS. Already established on Tysabri. She was due for infusion 06/02/2019. Last infusion 04/21/2019. She is past due. Last JCV drawn on 05/04/2019: negative, index: 0.19    HISTORY OF PRESENT ILLNESS:  I had the pleasure of seeing your patient, Nancy Jennings, at the MS center at [facility] for neurologic consultation regarding her multiple sclerosis.  She is a 38 year old woman who was diagnosed with MS in late 2019.  In September 2019, she had the onset of left neck pain and then had numbness and pain that went down the left arm and then right arm and then right leg.  Symptoms evolved over 1-2 days.   Due to discomfort, she had trouble sleeping.   She saw her PCP a few days later.   She was referred her to [facility] and saw Dr. Malvin Jennings.   Her symptoms had already resolved over 2-3 weeks but she continued to have painful tingling in the left arm that persisted.   She saw Dr. Malvin Jennings in October and had a NCV/EMG in [place].     Due to continued symptoms, she had a brain MRI.    It was consistent with MS and an MRI of the cervical spine was also ordered.   It showed a lesion to the left at C2 and a second lesion ventral midline at C5C6.   Her MRIs were done at [facility].    Due to having spinal cord plaques a decision was made to start Tysabri.   She is JCV Ab negative.  She has had infusion at [facility] but needs to switch infusion sites.    She has had 3 infusions.     She is one week overdue.     She continues to have dysesthesias.   She had trouble  with 300 mg gabapentin pills so is taking 100 mg a couple times a day.   She also takes nortriptyline 20 mg at times.   Besides the symptoms in the left arm, she notes no other neurologic symptoms.   Gait is fine.  She can climb stairs and ladders.    No weakness. Coordination is fine.   Her bladder is doing well.  She notes no changes in vision.  Color vision is fine.   No diplopia.  She notes a little more fatigue the past couple weeks.  After her third infusion, she felt energy was better.   She is now sleeping well.  Cognition and mood are fine.     MRI 1/102/20:  Cervical spinal cord: Ovoid, slightly expansile T2-hyperintense lesion in the left side of the spinal cord at the C2 vertebral level, measuring approximately 13 mm craniocaudal. Similar smaller, more subtle lesion in the ventral midline spinal cord,   MRI 10/16/2018 Brain: Multiple (greater than 10) T2/FLAIR-hyperintense white matter lesions in both cerebral hemispheres, both periventricular and juxtacortical, as well as, to a lesser extent, in both cerebellar hemispheres.  The brain volume is normal.   Incidental developmental venous anomaly.  I personally reviewed the MRI of the brain and cervical spine from 10/16/2018 and 10/30/2018.  The MRI of  the brain shows multiple T2/flair hyperintense foci in the periventricular, juxtacortical and deep white matter in a pattern and configuration consistent with chronic demyelinating plaque.  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  The MRI of the cervical spine shows 2 foci, 1 laterally to the left at C2 and another medially at C6.  REVIEW OF SYSTEMS: Constitutional: No fevers, chills, sweats, or change in appetite.  She notes fatigue. Eyes: No visual changes, double vision, eye pain Ear, nose and throat: No hearing loss, ear pain, nasal congestion, sore throat Cardiovascular: No chest pain, palpitations Respiratory: No shortness of breath at rest or with exertion.   No wheezes  GastrointestinaI: No nausea, vomiting, diarrhea, abdominal pain, fecal incontinence Genitourinary: No dysuria, urinary retention or frequency.  No nocturia. Musculoskeletal: No neck pain, back pain Integumentary: No rash, pruritus, skin lesions Neurological: as above Psychiatric: No depression at this time.  No anxiety Endocrine: No palpitations, diaphoresis, change in appetite, change in weigh or increased thirst Hematologic/Lymphatic: No anemia, purpura, petechiae. Allergic/Immunologic: No itchy/runny eyes, nasal congestion, recent allergic reactions, rashes  ALLERGIES: No Known Allergies  HOME MEDICATIONS:  Current Outpatient Medications:  .  Cholecalciferol (VITAMIN D3) 50 MCG (2000 UT) capsule, Take 2,000 Units by mouth daily. , Disp: , Rfl:  .  Ibuprofen (ADVIL PO), Take by mouth., Disp: , Rfl:  .  Loratadine 10 MG CAPS, Take by mouth., Disp: , Rfl:  .  medroxyPROGESTERone (DEPO-PROVERA) 150 MG/ML injection, Inject into the muscle., Disp: , Rfl:  .  nortriptyline (PAMELOR) 10 MG capsule, Take 20 mg by mouth at bedtime., Disp: , Rfl:   PAST MEDICAL HISTORY: Past Medical History:  Diagnosis Date  . Multiple sclerosis (HCC)   . Neuromuscular disorder (HCC)     PAST SURGICAL HISTORY: Past Surgical History:  Procedure Laterality Date  . DILATION AND CURETTAGE OF UTERUS    . FRACTURE SURGERY  02/2018   left ankle    FAMILY HISTORY: History reviewed. No pertinent family history.  SOCIAL HISTORY:  Social History   Socioeconomic History  . Marital status: Married    Spouse name: Nancy Jennings  . Number of children: Not on file  . Years of education: Not on file  . Highest education level: Not on file  Occupational History  . Occupation: [employer]   Social Needs  . Financial resource strain: Not on file  . Food insecurity    Worry: Not on file    Inability: Not on file  . Transportation needs    Medical: Not on file    Non-medical: Not on file  Tobacco Use  .  Smoking status: Never Smoker  . Smokeless tobacco: Never Used  Substance and Sexual Activity  . Alcohol use: Yes    Alcohol/week: 1.0 standard drinks    Types: 1 Shots of liquor per week    Comment: rarely - about 1x a month  . Drug use: Never  . Sexual activity: Not on file  Lifestyle  . Physical activity    Days per week: Not on file    Minutes per session: Not on file  . Stress: Not on file  Relationships  . Social Musicianconnections    Talks on phone: Not on file    Gets together: Not on file    Attends religious service: Not on file    Active member of club or organization: Not on file    Attends meetings of clubs or organizations: Not on file    Relationship status: Not on  file  . Intimate partner violence    Fear of current or ex partner: Not on file    Emotionally abused: Not on file    Physically abused: Not on file    Forced sexual activity: Not on file  Other Topics Concern  . Not on file  Social History Narrative   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   Right handed    Lives with husband     PHYSICAL EXAM  Vitals:   06/09/19 1302  BP: (!) 139/96  Pulse: 100  Temp: 98.2 F (36.8 C)  Weight: (!) 312 lb 8 oz (141.7 kg)  Height: 5\' 8"  (1.727 m)    Body mass index is 47.52 kg/m.   General: The patient is well-developed and well-nourished and in no acute distress  HEENT:  Head is [place]/AT.  Sclera are anicteric.  Funduscopic exam shows normal optic discs and retinal vessels.  Neck: No carotid bruits are noted.  The neck is nontender.  Cardiovascular: The heart has a regular rate and rhythm with a normal S1 and S2. There were no murmurs, gallops or rubs.    Skin: Extremities are without rash or  edema.  Musculoskeletal:  Back is nontender  Neurologic Exam  Mental status: The patient is alert and oriented x 3 at the time of the examination. The patient has apparent normal recent and remote memory, with an apparently normal attention span and concentration ability.    Speech is normal.  Cranial nerves: Extraocular movements are full. Pupils are equal, round, and reactive to light and accomodation.  Visual fields are full.  Facial symmetry is present. There is good facial sensation to soft touch bilaterally.Facial strength is normal.  Trapezius and sternocleidomastoid strength is normal. No dysarthria is noted.  The tongue is midline, and the patient has symmetric elevation of the soft palate. No obvious hearing deficits are noted.  Motor:  Muscle bulk is normal.   Tone is normal. Strength is  5 / 5 in all 4 extremities.   Sensory: Sensory testing is intact to pinprick, soft touch and vibration sensation in all 4 extremities.  Coordination: Cerebellar testing reveals good finger-nose-finger and heel-to-shin bilaterally.  Gait and station: Station is normal.   Gait is normal. Tandem gait is normal. Romberg is negative.   Reflexes: Deep tendon reflexes are symmetric and normal bilaterally.   Plantar responses are flexor.    DIAGNOSTIC DATA (LABS, IMAGING, TESTING) - I reviewed patient records, labs, notes, testing and imaging myself where available.  Lab Results  Component Value Date   WBC 4.8 10/28/2018   HGB 13.7 10/28/2018   HCT 44.2 10/28/2018   MCV 81.4 10/28/2018   PLT 256 10/28/2018       ASSESSMENT AND PLAN  1. Multiple sclerosis ([place])   2. Numbness and tingling in left arm   3. 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  She has 2 MS lesions within her spinal cord and 10 or more lesions of the brain.  Due to the spinal cord lesions implying potential for a more aggressive MS, she was started on Tysabri.  She is tolerating Tysabri well and has not had any further exacerbation.  Tysabri is an appropriate medication for her and she will continue.  We will get her scheduled as soon as possible for her next  infusion.  Her last JCV antibody test was recent  and was negative.  We will continue to check every 6 months.  For the dysesthetic pain, she will increase the nortriptyline to 30 mg nightly.  If symptoms do not improve after a few more weeks I will add gabapentin.  She is advised to stay active and exercise as tolerated.  Continue vitamin D supplements.  She can follow-up with me or Dr. Malvin JohnsPotter at her choice.  If she follows up with me, she will return in 4 months or sooner if there are new or worsening neurologic symptoms.    Thank you for asking me to see Ms. Trolinger.  Please let me know if I can be of further assistance with her or other patients in the future.  Halona Amstutz A. Epimenio FootSater, MD, PhD, FAAN Certified in Neurology, Clinical Neurophysiology, Sleep Medicine, Pain Medicine and Neuroimaging Director, [facility]  [facility] [address], [address] [place], [place] [id number] [phone]  Addendum:

## 2019-06-15 ENCOUNTER — Encounter

## 2019-06-15 ENCOUNTER — Ambulatory Visit: Payer: 59 | Admitting: Neurology

## 2019-07-08 NOTE — Telephone Encounter (Signed)
Per Graylon Gunning, patient coming 07/13/2019 for complementary Tysabri dose until they work out her insurance auth.

## 2019-07-14 ENCOUNTER — Ambulatory Visit: Payer: 59

## 2019-07-14 ENCOUNTER — Ambulatory Visit: Payer: 59 | Admitting: Hematology and Oncology

## 2019-07-26 ENCOUNTER — Telehealth: Payer: Self-pay | Admitting: *Deleted

## 2019-07-26 NOTE — Telephone Encounter (Signed)
Spoke with Liane in infusion suite. Verified pt received complementary dose of Tysabri 07/13/2019. They scheduled for next dose and still working on [employer]. MD aware.

## 2019-07-28 NOTE — Telephone Encounter (Signed)
Received fax from [facility] for natalizumab 1mg  injections 300 units have been approved from 07/13/2019-07/11/2020. Procedure code [phone]. Copy of this authorization has been given to Liane in the infusion suite.  Service Ref#: [id number]

## 2019-08-10 ENCOUNTER — Telehealth: Payer: Self-pay | Admitting: *Deleted

## 2019-08-10 NOTE — Telephone Encounter (Signed)
Spoke with Graylon Gunning, RN with intrafusion. Pt scheduled for this Thursday for Tysabri.

## 2019-10-06 ENCOUNTER — Encounter: Payer: Self-pay | Admitting: Neurology

## 2019-10-06 ENCOUNTER — Ambulatory Visit (INDEPENDENT_AMBULATORY_CARE_PROVIDER_SITE_OTHER): Payer: 59 | Admitting: Neurology

## 2019-10-06 ENCOUNTER — Other Ambulatory Visit: Payer: Self-pay

## 2019-10-06 DIAGNOSIS — Z79899 Other long term (current) drug therapy: Secondary | ICD-10-CM | POA: Diagnosis not present

## 2019-10-06 DIAGNOSIS — G35 Multiple sclerosis: Secondary | ICD-10-CM | POA: Diagnosis not present

## 2019-10-06 DIAGNOSIS — G4489 Other headache syndrome: Secondary | ICD-10-CM | POA: Insufficient documentation

## 2019-10-06 MED ORDER — INDOMETHACIN 25 MG PO CAPS: ORAL_CAPSULE | ORAL | 2 refills | Status: DC

## 2019-10-06 NOTE — Progress Notes (Signed)
[facility]  PATIENT: Nancy Jennings DOB: 06/07/1981  REFERRING DOCTOR OR PCP:  Titus MouldElizabeth Burney White, NP (PCP); Dr. Malvin JohnsPotter (Neurology) SOURCE: Patient, notes from Dr. Malvin JohnsPotter, imaging and laboratory reports,  _________________________________   HISTORICAL  CHIEF COMPLAINT:  Chief Complaint  Patient presents with  . Follow-up    RM 12. Last seen 06/09/2019.   . Multiple Sclerosis    On Tysabri. Last JCV 05/04/2019: negative, index: 0.19. NEEDS LABS  . Headache    Pt complaining of recent increase in headaches.    HISTORY OF PRESENT ILLNESS:  Nancy Jennings is a 38 year old woman with relapsing remitting multiple sclerosis.  Update 10/06/2019: She is on Tysabri for MS and tolerates it well.   Her next infusion is tomorrow.  Her first infusion was April 2020.    Last JCV ws 09/21/2019 and was negative at 0.29.     The MS seems stable and she has no exacerbation.    She notes gait, balance, coordination, strength are fine.   She has more stable mild numbness on the left and  intermittent numbness in the right arm.    Vision is doing well.   She notes no bladder difficulty.   Fatigue is ok.   She sleeps well most nights.   Cognition and mood are doing well.  She had a more severe than typical headache the past week for a few days.   The headache lasted a few days and did not improve with NSAIDs, caffeine or pseudoephedrine.     She had some left leg sciatic pain the past few days..   She took a gabapentin (usually does not take as it makes her sleepy)   Initial consultation 06/09/2019: She is a 38 year old woman who was diagnosed with MS in late 2019.  In September 2019, she had the onset of left neck pain and then had numbness and pain that went down the left arm and then right arm and then right leg.  Symptoms evolved over 1-2 days.   Due to discomfort, she had trouble sleeping.   She saw her PCP a few days later.   She was [facility] and saw Dr. Malvin JohnsPotter.   Her symptoms had already resolved over 2-3 weeks but she continued to have painful tingling in the left arm that persisted.   She saw Dr. Malvin JohnsPotter in October and [place]     Due to continued symptoms, she had a brain MRI.    It was consistent with MS and an MRI of the cervical spine was also ordered.   It showed a lesion to the left at C2 and a second lesion ventral midline at C5C6.   Her MRIs were done at [facility].    Due to having spinal cord plaques a decision was made to start Tysabri.   She is JCV Ab negative.  [facility] but needs to switch infusion sites.    She has had 3 infusions.     She is one week overdue.     She continues to have dysesthesias.   She had trouble with 300 mg gabapentin pills so is taking 100 mg a couple times a day.   She also takes nortriptyline 20 mg at times.   Besides the symptoms in the left arm, she notes no other neurologic symptoms.   Gait is fine.  She can climb stairs and ladders.  No weakness. Coordination is fine.   Her bladder is doing well.  She notes no changes in vision.  Color vision is fine.   No diplopia.  She notes a little more fatigue the past couple weeks.  After her third infusion, she felt energy was better.   She is now sleeping well.  Cognition and mood are fine.     MRI 1/102/20:  Cervical spinal cord: Ovoid, slightly expansile T2-hyperintense lesion in the left side of the spinal cord at the C2 vertebral level, measuring approximately 13 mm craniocaudal. Similar smaller, more subtle lesion in the ventral midline spinal cord,   MRI 10/16/2018 Brain: Multiple (greater than 10) T2/FLAIR-hyperintense white matter lesions in both cerebral hemispheres, both periventricular and juxtacortical, as well as, to a lesser extent, in both cerebellar hemispheres.  The brain volume is normal.   Incidental developmental venous anomaly.  I personally reviewed the MRI of the brain and  cervical spine from 10/16/2018 and 10/30/2018.  The MRI of the brain shows multiple T2/flair hyperintense foci in the periventricular, juxtacortical and deep white matter in a pattern and configuration consistent with chronic demyelinating plaque.  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  The MRI of the cervical spine shows 2 foci, 1 laterally to the left at C2 and another medially at C6.  REVIEW OF SYSTEMS: Constitutional: No fevers, chills, sweats, or change in appetite.  She notes fatigue. Eyes: No visual changes, double vision, eye pain Ear, nose and throat: No hearing loss, ear pain, nasal congestion, sore throat Cardiovascular: No chest pain, palpitations Respiratory: No shortness of breath at rest or with exertion.   No wheezes GastrointestinaI: No nausea, vomiting, diarrhea, abdominal pain, fecal incontinence Genitourinary: No dysuria, urinary retention or frequency.  No nocturia. Musculoskeletal: No neck pain, back pain Integumentary: No rash, pruritus, skin lesions Neurological: as above Psychiatric: No depression at this time.  No anxiety Endocrine: No palpitations, diaphoresis, change in appetite, change in weigh or increased thirst Hematologic/Lymphatic: No anemia, purpura, petechiae. Allergic/Immunologic: No itchy/runny eyes, nasal congestion, recent allergic reactions, rashes  ALLERGIES: No Known Allergies  HOME MEDICATIONS:  Current Outpatient Medications:  .  Cholecalciferol (VITAMIN D3) 50 MCG (2000 UT) capsule, Take 2,000 Units by mouth daily. , Disp: , Rfl:  .  Ibuprofen (ADVIL PO), Take by mouth., Disp: , Rfl:  .  Loratadine 10 MG CAPS, Take by mouth., Disp: , Rfl:  .  medroxyPROGESTERone (DEPO-PROVERA) 150 MG/ML injection, Inject into the muscle., Disp: , Rfl:  .  nortriptyline (PAMELOR) 10 MG capsule, Take 20 mg by mouth at bedtime., Disp: , Rfl:  .  indomethacin (INDOCIN) 25 MG capsule, One po twice a day as needed prn headache, Disp: 30 capsule,  Rfl: 2  PAST MEDICAL HISTORY: Past Medical History:  Diagnosis Date  . Multiple sclerosis ([place])   . Neuromuscular disorder ([place])     PAST SURGICAL HISTORY: Past Surgical History:  Procedure Laterality Date  . DILATION AND CURETTAGE OF UTERUS    . FRACTURE SURGERY  02/2018   left ankle    FAMILY HISTORY: History reviewed. No pertinent family history.  SOCIAL HISTORY:  Social History   Socioeconomic History  . Marital status: Married    Spouse name: Barbaraann Rondo  . Number of children: Not on file  . Years of education: Not on file  . Highest education level: Not on file  Occupational History  . Occupation: [employer]   Tobacco Use  . Smoking status: Never Smoker  . Smokeless tobacco:  Never Used  Substance and Sexual Activity  . Alcohol use: Yes    Alcohol/week: 1.0 standard drinks    Types: 1 Shots of liquor per week    Comment: rarely - about 1x a month  . Drug use: Never  . Sexual activity: Not on file  Other Topics Concern  . Not on file  Social History Narrative   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   Right handed    Lives with husband   Social Determinants of Health   Financial Resource Strain:   . Difficulty of Paying Living Expenses: Not on file  Food Insecurity:   . Worried About Programme researcher, broadcasting/film/video in the Last Year: Not on file  . Ran Out of Food in the Last Year: Not on file  Transportation Needs:   . Lack of Transportation (Medical): Not on file  . Lack of Transportation (Non-Medical): Not on file  Physical Activity:   . Days of Exercise per Week: Not on file  . Minutes of Exercise per Session: Not on file  Stress:   . Feeling of Stress : Not on file  Social Connections:   . Frequency of Communication with Friends and Family: Not on file  . Frequency of Social Gatherings with Friends and Family: Not on file  . Attends Religious Services: Not on file  . Active Member of Clubs or Organizations: Not on file  . Attends Banker Meetings: Not on file   . Marital Status: Not on file  Intimate Partner Violence:   . Fear of Current or Ex-Partner: Not on file  . Emotionally Abused: Not on file  . Physically Abused: Not on file  . Sexually Abused: Not on file     PHYSICAL EXAM  Vitals:   10/06/19 1437  BP: (!) 150/94  Pulse: 92  Weight: (!) 316 lb (143.3 kg)  Height: 5\' 8"  (1.727 m)    Body mass index is 48.05 kg/m.   General: The patient is well-developed and well-nourished and in no acute distress  HEENT:  Head is [place]/AT.  Sclera are anicteric.  Funduscopic exam shows normal optic discs and retinal vessels.  Neck: No carotid bruits are noted.  The neck is nontender.  Cardiovascular: The heart has a regular rate and rhythm with a normal S1 and S2. There were no murmurs, gallops or rubs.    Skin: Extremities are without rash or  edema.  Musculoskeletal:  Back is nontender  Neurologic Exam  Mental status: The patient is alert and oriented x 3 at the time of the examination. The patient has apparent normal recent and remote memory, with an apparently normal attention span and concentration ability.   Speech is normal.  Cranial nerves: Extraocular movements are full. Pupils are equal, round, and reactive to light and accomodation.  Visual fields are full.  Facial symmetry is present. There is good facial sensation to soft touch bilaterally.Facial strength is normal.  Trapezius and sternocleidomastoid strength is normal. No dysarthria is noted.  The tongue is midline, and the patient has symmetric elevation of the soft palate. No obvious hearing deficits are noted.  Motor:  Muscle bulk is normal.   Tone is normal. Strength is  5 / 5 in all 4 extremities.   Sensory: Sensory testing is intact to pinprick, soft touch and vibration sensation in all 4 extremities.  Coordination: Cerebellar testing reveals good finger-nose-finger and heel-to-shin bilaterally.  Gait and station: Station is normal.   Gait is normal. Tandem gait  is  normal. Romberg is negative.   Reflexes: Deep tendon reflexes are symmetric and normal bilaterally.   Plantar responses are flexor.    DIAGNOSTIC DATA (LABS, IMAGING, TESTING) - I reviewed patient records, labs, notes, testing and imaging myself where available.  Lab Results  Component Value Date   WBC 4.8 10/28/2018   HGB 13.7 10/28/2018   HCT 44.2 10/28/2018   MCV 81.4 10/28/2018   PLT 256 10/28/2018       ASSESSMENT AND PLAN  1. Multiple sclerosis (HCC)   2. Other headache syndrome   3. High risk medication use     1.   Continue Tysabri 2.   Indomethacin prn headache.  If symptoms persist consider a prophylactic agent.    Richard A. Epimenio Foot, MD, PhD, FAAN Certified in Neurology, Clinical Neurophysiology, Sleep Medicine, Pain Medicine and Neuroimaging Director, [facility] Sclerosis Center at [facility]  [facility] [address] [place], [place] [address] [phone]  Addendum:

## 2019-10-19 ENCOUNTER — Ambulatory Visit (INDEPENDENT_AMBULATORY_CARE_PROVIDER_SITE_OTHER): Payer: 59

## 2019-10-19 ENCOUNTER — Other Ambulatory Visit: Payer: Self-pay

## 2019-10-19 DIAGNOSIS — G35 Multiple sclerosis: Secondary | ICD-10-CM

## 2019-10-19 MED ORDER — GADOBENATE DIMEGLUMINE 529 MG/ML IV SOLN
15.0000 mL | Freq: Once | INTRAVENOUS | Status: AC | PRN
  Administered 2019-10-19: 15 mL via INTRAVENOUS

## 2019-10-25 ENCOUNTER — Telehealth: Payer: Self-pay | Admitting: *Deleted

## 2019-10-25 NOTE — Telephone Encounter (Signed)
-----   Message from Asa Lente, MD sent at 10/20/2019  5:14 PM EST ----- Please let her know that the MRI it looked pretty good.  There was nothing that looked brand-new though there was 1 small lesion that was not on the MRI from last year (it probably happened before she started Tysabri).  Some of the lesions actually look better on the current MRI than they did last year.

## 2020-03-15 ENCOUNTER — Telehealth: Payer: Self-pay | Admitting: *Deleted

## 2020-03-15 NOTE — Telephone Encounter (Signed)
Received fax report from [facility]. Confirmed JCV ab collected on 02/03/20 indeterminate, index: 0.36. Inhibition assay: negative. Sent copy to be scanned to epic.

## 2020-03-15 NOTE — Telephone Encounter (Signed)
[facility] at [phone] to get results of JCV ab lab result that was drawn on 02/03/20. Pt coming to our office tomorrow for her next infusion. Freddi Starr, RN w/ intrafusion requesting results. They stated result is: indeterminate Index: 0.36. Inhibition assay: negative. She will fax [place] a copy at [phone].

## 2020-04-05 ENCOUNTER — Ambulatory Visit: Payer: 59 | Admitting: Neurology

## 2020-04-06 ENCOUNTER — Other Ambulatory Visit: Payer: Self-pay | Admitting: Certified Nurse Midwife

## 2020-04-06 DIAGNOSIS — Z1231 Encounter for screening mammogram for malignant neoplasm of breast: Secondary | ICD-10-CM

## 2020-05-30 ENCOUNTER — Ambulatory Visit
Admission: RE | Admit: 2020-05-30 | Discharge: 2020-05-30 | Disposition: A | Discharge status: Home / Self Care | Payer: 59 | Source: Ambulatory Visit | Attending: Certified Nurse Midwife | Admitting: Certified Nurse Midwife

## 2020-05-30 ENCOUNTER — Other Ambulatory Visit: Payer: Self-pay

## 2020-05-30 DIAGNOSIS — Z1231 Encounter for screening mammogram for malignant neoplasm of breast: Secondary | ICD-10-CM | POA: Diagnosis present

## 2020-06-13 ENCOUNTER — Ambulatory Visit (INDEPENDENT_AMBULATORY_CARE_PROVIDER_SITE_OTHER): Payer: 59 | Admitting: Neurology

## 2020-06-13 ENCOUNTER — Encounter: Payer: Self-pay | Admitting: Neurology

## 2020-06-13 ENCOUNTER — Other Ambulatory Visit: Payer: Self-pay

## 2020-06-13 VITALS — BP 145/87 | HR 110 | Ht 68.0 in | Wt 336.5 lb

## 2020-06-13 DIAGNOSIS — Z79899 Other long term (current) drug therapy: Secondary | ICD-10-CM

## 2020-06-13 DIAGNOSIS — G4700 Insomnia, unspecified: Secondary | ICD-10-CM

## 2020-06-13 DIAGNOSIS — G35 Multiple sclerosis: Secondary | ICD-10-CM

## 2020-06-13 DIAGNOSIS — G4489 Other headache syndrome: Secondary | ICD-10-CM

## 2020-06-13 DIAGNOSIS — R202 Paresthesia of skin: Secondary | ICD-10-CM

## 2020-06-13 DIAGNOSIS — R200 Anesthesia of skin: Secondary | ICD-10-CM | POA: Diagnosis not present

## 2020-06-13 NOTE — Progress Notes (Signed)
[facility]  PATIENT: Nancy Jennings DOB: 1981-06-27  REFERRING DOCTOR OR PCP:  [name], NP (PCP); Dr. [name] (Neurology) SOURCE: Patient, notes from Dr. [name], imaging and laboratory reports,  _________________________________   HISTORICAL  CHIEF COMPLAINT:  Chief Complaint  Patient presents with  . Follow-up    RM 13 with mother. Last seen 10/06/2019. She wants to discuss following only Dr. Epimenio Foot moving forward. The only issue is her insurance. They only allow 6 visits per year and they count OV and infusions. She is trying to figure out how to get this approved.   . Multiple Sclerosis    On Tysabri. Last: 06/08/20 and next: 07/10/20. Last JCV 02/03/20 negative, index: 0.36.   Marland Kitchen Headache    Has been having an increase in headaches. Spoke with Dr. [name] about this back in June. She is now taking magnesium pill per PCP request. She saw eye doctor. Wanting to discuss other options to help with her headaches.     HISTORY OF PRESENT ILLNESS:  Nancy Jennings is a 39 year old woman with relapsing remitting multiple sclerosis.  Update 06/13/2020: She is on Tysabri as her DMT.   She does infusions here and just did her 15th infusion.   She tolerates it well and has not had any definite exacerbations.      She reports pain in the arms.  Pain radiates into the hands, left > right.   The 2nd and 3rd fingers are most involved when pain enters the hand.   She denies definite weakness in her arms and legs.      She is on nortriptyline 30 mg nightly.  Gabapentin makes her dizzy even at 300 mg at night.     She also has had some migraine headaches some lasting up to 4 hours. She started   Last JCV was 04/05/2020 and was negative at 0.23.      MS History: She was diagnosed with MS in late 2019.  In September 2019, she had the onset of left neck pain and then had numbness and pain that went down the left arm and then right arm and then right leg.   Symptoms evolved over 1-2 days.   Due to discomfort, she had trouble sleeping.   She saw her PCP a few days later.   She was referred her to [facility] and saw Dr. [name].   Her symptoms had already resolved over 2-3 weeks but she continued to have painful tingling in the left arm that persisted.   She saw Dr. [name] in October and had a NCV/EMG in [place].     Due to continued symptoms, she had a brain MRI.    It was consistent with MS and an MRI of the cervical spine was also ordered.   It showed a lesion to the left at C2 and a second lesion ventral midline at C5C6.   Her MRIs were done at [facility].  She was started on  Tysabri.   She is JCV Ab negative.    MRI 1/102/20:  Cervical spinal cord: Ovoid, slightly expansile T2-hyperintense lesion in the left side of the spinal cord at the C2 vertebral level, measuring approximately 13 mm craniocaudal. Similar smaller, more subtle lesion in the ventral midline spinal cord at C6  MRI 10/16/2018 Brain: Multiple (greater than 10) T2/FLAIR-hyperintense white matter lesions in both cerebral hemispheres, both periventricular and juxtacortical, as well as, to a lesser extent, in both cerebellar hemispheres.  The brain volume is normal.   Incidental right frontal developmental venous anomaly.  MRI 10/18/2020 showed multiple T2/FLAIR hyperintense foci in the hemispheres and also a focus in the left cerebellar hemisphere and in the spinal cord.  These are consistent with chronic demyelinating plaque associated with multiple sclerosis.  None of the foci appears to be acute.  However, one focus in the left frontal juxtacortical white matter was not present on the previous MRI and likely occurred during the 1 year interval.   REVIEW OF SYSTEMS: Constitutional: No fevers, chills, sweats, or change in appetite.  She notes fatigue. Eyes: No visual changes, double vision, eye pain Ear, nose and throat: No hearing loss, ear pain, nasal congestion, sore  throat Cardiovascular: No chest pain, palpitations Respiratory: No shortness of breath at rest or with exertion.   No wheezes GastrointestinaI: No nausea, vomiting, diarrhea, abdominal pain, fecal incontinence Genitourinary: No dysuria, urinary retention or frequency.  No nocturia. Musculoskeletal: No neck pain, back pain Integumentary: No rash, pruritus, skin lesions Neurological: as above Psychiatric: No depression at this time.  No anxiety Endocrine: No palpitations, diaphoresis, change in appetite, change in weigh or increased thirst Hematologic/Lymphatic: No anemia, purpura, petechiae. Allergic/Immunologic: No itchy/runny eyes, nasal congestion, recent allergic reactions, rashes  ALLERGIES: No Known Allergies  HOME MEDICATIONS:  Current Outpatient Medications:  .  Cholecalciferol (VITAMIN D3) 50 MCG (2000 UT) capsule, Take 2,000 Units by mouth daily. , Disp: , Rfl:  .  Ibuprofen (ADVIL PO), Take by mouth., Disp: , Rfl:  .  indomethacin (INDOCIN) 25 MG capsule, One po twice a day as needed prn headache, Disp: 30 capsule, Rfl: 2 .  Loratadine 10 MG CAPS, Take by mouth., Disp: , Rfl:  .  medroxyPROGESTERone (DEPO-PROVERA) 150 MG/ML injection, Inject into the muscle., Disp: , Rfl:  .  natalizumab (TYSABRI) 300 MG/15ML injection, Inject 300 mg into the vein every 28 (twenty-eight) days., Disp: , Rfl:  .  nortriptyline (PAMELOR) 10 MG capsule, Take 20 mg by mouth at bedtime., Disp: , Rfl:   PAST MEDICAL HISTORY: Past Medical History:  Diagnosis Date  . Multiple sclerosis (HCC)   . Neuromuscular disorder (HCC)     PAST SURGICAL HISTORY: Past Surgical History:  Procedure Laterality Date  . DILATION AND CURETTAGE OF UTERUS    . FRACTURE SURGERY  02/2018   left ankle    FAMILY HISTORY: History reviewed. No pertinent family history.  SOCIAL HISTORY:  Social History   Socioeconomic History  . Marital status: Married    Spouse name: Nancy Jennings  . Number of children: Not on  file  . Years of education: Not on file  . Highest education level: Not on file  Occupational History  . Occupation: [employer]   Tobacco Use  . Smoking status: Never Smoker  . Smokeless tobacco: Never Used  Substance and Sexual Activity  . Alcohol use: Yes    Alcohol/week: 1.0 standard drink    Types: 1 Shots of liquor per week    Comment: rarely - about 1x a month  . Drug use: Never  . Sexual activity: Not on file  Other Topics Concern  . Not on file  Social History Narrative   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   Right handed    Lives with husband   Social Determinants of Health   Financial Resource Strain:   . Difficulty of Paying Living Expenses: Not on file  Food Insecurity:   . Worried About Programme researcher, broadcasting/film/video in the Last Year:  Not on file  . Ran Out of Food in the Last Year: Not on file  Transportation Needs:   . Lack of Transportation (Medical): Not on file  . Lack of Transportation (Non-Medical): Not on file  Physical Activity:   . Days of Exercise per Week: Not on file  . Minutes of Exercise per Session: Not on file  Stress:   . Feeling of Stress : Not on file  Social Connections:   . Frequency of Communication with Friends and Family: Not on file  . Frequency of Social Gatherings with Friends and Family: Not on file  . Attends Religious Services: Not on file  . Active Member of Clubs or Organizations: Not on file  . Attends Banker Meetings: Not on file  . Marital Status: Not on file  Intimate Partner Violence:   . Fear of Current or Ex-Partner: Not on file  . Emotionally Abused: Not on file  . Physically Abused: Not on file  . Sexually Abused: Not on file     PHYSICAL EXAM  Vitals:   06/13/20 1525  BP: (!) 145/87  Pulse: (!) 110  Weight: (!) 336 lb 8 oz (152.6 kg)  Height: 5\' 8"  (1.727 m)    Body mass index is 51.16 kg/m.   General: The patient is well-developed and well-nourished and in no acute distress  HEENT:  Head is /AT.  Sclera  are anicteric.      Skin: Extremities are without rash or  edema.  Neurologic Exam  Mental status: The patient is alert and oriented x 3 at the time of the examination. The patient has apparent normal recent and remote memory, with an apparently normal attention span and concentration ability.   Speech is normal.  Cranial nerves: Extraocular movements are full.  .  Facial symmetry is present. There is good facial sensation to soft touch bilaterally.Facial strength is normal.  Trapezius and sternocleidomastoid strength is normal. No dysarthria is noted.  No obvious hearing deficits are noted.  Motor:  Muscle bulk is normal.   Tone is normal. Strength is  5 / 5 in all 4 extremities.   Sensory: On sensory exam, she has mildly reduced sensation to temperature and touch in the right arm and leg relative to the left but symmetric sensation to vibration  Coordination: Cerebellar testing reveals good finger-nose-finger and heel-to-shin bilaterally.  Gait and station: Station is normal.   Gait is normal. Tandem gait is wide. Romberg is negative.   Reflexes: Deep tendon reflexes are symmetric and normal bilaterally.   Plantar responses are flexor.    DIAGNOSTIC DATA (LABS, IMAGING, TESTING) - I reviewed patient records, labs, notes, testing and imaging myself where available.  Lab Results  Component Value Date   WBC 4.8 10/28/2018   HGB 13.7 10/28/2018   HCT 44.2 10/28/2018   MCV 81.4 10/28/2018   PLT 256 10/28/2018       ASSESSMENT AND PLAN  1. Multiple sclerosis (HCC)   2. Other headache syndrome   3. High risk medication use   4. Numbness and tingling in left arm   5. Insomnia, unspecified type     1.   Continue Tysabri.   She is JCV Ab negative checked in June 2021 2.   Try to increase gabapentin to 300 mg po tid slowly using 100 mg pills and titrate up.   Consider  3.   If left arm symptoms worsen, check MRI brain and cervical spine.   Otherwise will check  early next year  to follow up and dteermine if any breakthrough activity.   4.   RTC 6 months or sooner if new or worsening   Dyllen Menning A. Epimenio Foot, MD, PhD, FAAN Certified in Neurology, Clinical Neurophysiology, Sleep Medicine, Pain Medicine and Neuroimaging Director, Multiple Sclerosis Center at [facility]  [facility] [address], [address] [place], [place] [address] [phone]  Addendum:

## 2020-07-03 ENCOUNTER — Other Ambulatory Visit: Payer: Self-pay | Admitting: Neurology

## 2020-07-03 MED ORDER — GABAPENTIN 100 MG PO CAPS: ORAL_CAPSULE | ORAL | 5 refills | Status: DC

## 2020-07-25 ENCOUNTER — Telehealth: Payer: Self-pay | Admitting: *Deleted

## 2020-07-25 NOTE — Telephone Encounter (Signed)
Received fax from Tysabri touch prescribing program that pt re-authorized 01/28/20-07/28/20. Pt enrollment number: [id number]. Accout: [employer]. Site auth number: [id number].

## 2020-08-01 ENCOUNTER — Other Ambulatory Visit: Payer: Self-pay | Admitting: *Deleted

## 2020-08-01 DIAGNOSIS — Z79899 Other long term (current) drug therapy: Secondary | ICD-10-CM

## 2020-08-01 DIAGNOSIS — G35 Multiple sclerosis: Secondary | ICD-10-CM

## 2020-08-01 NOTE — Telephone Encounter (Addendum)
Nancy Jennings from [employer] called stating that a re-authorization has been faxed over for the pt's Tysabri and that she will be unauthorized to receive her Infusions until that is sent back. Pt's next Infusion appt is on 08/10/20 Please advise. [phone]

## 2020-08-01 NOTE — Telephone Encounter (Signed)
Received fax from touch prescribing program that pt re-authorized for Tysabri from 07/29/20-01/27/21. Patient enrollment number: [id number]. Account:[employer]. Site auth number: [id number].

## 2020-08-01 NOTE — Progress Notes (Signed)
Pt due for next Tysabri 08/10/20. She is due for labs. Placed future orders and informed Liane,RN in the infusion suite as well.

## 2020-08-01 NOTE — Telephone Encounter (Signed)
Faxed completed/signed Tysabri pt status report and reauth questionnaire to [facility] at [phone]. Received confirmation.  

## 2020-08-01 NOTE — Telephone Encounter (Signed)
Called [employer] back and spoke with [place]. Advised we have not received questionnaire form to fill out. She will fax it to [place] at [phone].

## 2020-08-10 ENCOUNTER — Telehealth: Payer: Self-pay | Admitting: *Deleted

## 2020-08-10 NOTE — Telephone Encounter (Signed)
Placed JCV lab in [facility] lock box for routine lab pick up. Results pending. 

## 2020-08-10 NOTE — Addendum Note (Signed)
Addended by: Tamera Stands D on: 08/10/2020 01:59 PM   Modules accepted: Orders

## 2020-08-11 LAB — CBC WITH DIFFERENTIAL/PLATELET
Basophils Absolute: 0 10*3/uL (ref 0.0–0.2)
Basos: 1 %
EOS (ABSOLUTE): 0.2 10*3/uL (ref 0.0–0.4)
Eos: 4 %
Hematocrit: 38.6 % (ref 34.0–46.6)
Hemoglobin: 12.5 g/dL (ref 11.1–15.9)
Immature Grans (Abs): 0 10*3/uL (ref 0.0–0.1)
Immature Granulocytes: 1 %
Lymphocytes Absolute: 1.6 10*3/uL (ref 0.7–3.1)
Lymphs: 29 %
MCH: 25.3 pg — ABNORMAL LOW (ref 26.6–33.0)
MCHC: 32.4 g/dL (ref 31.5–35.7)
MCV: 78 fL — ABNORMAL LOW (ref 79–97)
Monocytes Absolute: 0.6 10*3/uL (ref 0.1–0.9)
Monocytes: 10 %
Neutrophils Absolute: 3.1 10*3/uL (ref 1.4–7.0)
Neutrophils: 55 %
Platelets: 235 10*3/uL (ref 150–450)
RBC: 4.94 x10E6/uL (ref 3.77–5.28)
RDW: 13.9 % (ref 11.7–15.4)
WBC: 5.6 10*3/uL (ref 3.4–10.8)

## 2020-08-17 LAB — STRATIFY JCV AB (W/ INDEX) W/ RFLX
Index Value: 0.27 — ABNORMAL HIGH
Stratify JCV (TM) Ab w/Reflex Inhibition: UNDETERMINED — AB

## 2020-08-17 LAB — RFLX STRATIFY JCV (TM) AB INHIBITION: JCV Antibody by Inhibition: NEGATIVE

## 2020-08-22 NOTE — Telephone Encounter (Signed)
JCV ab indeterminate, index: 0.27. Inhibition assay: negative

## 2020-08-25 ENCOUNTER — Other Ambulatory Visit: Payer: Self-pay | Admitting: Neurology

## 2020-08-25 MED ORDER — NORTRIPTYLINE HCL 10 MG PO CAPS
ORAL_CAPSULE | ORAL | 11 refills | Status: DC
Start: 2020-08-25 — End: 2021-08-14

## 2020-10-01 ENCOUNTER — Other Ambulatory Visit: Payer: Self-pay | Admitting: Neurology

## 2020-12-20 ENCOUNTER — Ambulatory Visit: Payer: 59 | Admitting: Neurology

## 2020-12-20 ENCOUNTER — Encounter: Payer: Self-pay | Admitting: Neurology

## 2020-12-20 ENCOUNTER — Telehealth: Payer: Self-pay | Admitting: *Deleted

## 2020-12-20 VITALS — BP 135/85 | HR 96 | Ht 68.0 in | Wt 339.0 lb

## 2020-12-20 DIAGNOSIS — G4700 Insomnia, unspecified: Secondary | ICD-10-CM

## 2020-12-20 DIAGNOSIS — G35 Multiple sclerosis: Secondary | ICD-10-CM | POA: Diagnosis not present

## 2020-12-20 DIAGNOSIS — M79602 Pain in left arm: Secondary | ICD-10-CM

## 2020-12-20 DIAGNOSIS — M79601 Pain in right arm: Secondary | ICD-10-CM | POA: Diagnosis not present

## 2020-12-20 DIAGNOSIS — R200 Anesthesia of skin: Secondary | ICD-10-CM | POA: Diagnosis not present

## 2020-12-20 DIAGNOSIS — G4489 Other headache syndrome: Secondary | ICD-10-CM

## 2020-12-20 DIAGNOSIS — Z79899 Other long term (current) drug therapy: Secondary | ICD-10-CM

## 2020-12-20 DIAGNOSIS — R202 Paresthesia of skin: Secondary | ICD-10-CM

## 2020-12-20 MED ORDER — ZONISAMIDE 100 MG PO CAPS: 100.0000 mg | ORAL_CAPSULE | Freq: Every day | ORAL | 3 refills | Status: DC

## 2020-12-20 NOTE — Progress Notes (Signed)
[facility]  PATIENT: Nancy Jennings DOB: [birth year]  REFERRING DOCTOR OR PCP:  [name], NP (PCP); Dr. [name] (Neurology) SOURCE: Patient, notes from Dr. [name], imaging and laboratory reports,  _________________________________   HISTORICAL  CHIEF COMPLAINT:  Chief Complaint  Patient presents with   Follow-up    RM 12, alone. Last seen 06/13/2020. On Tysabri for MS. Last infusion: 12/07/20, next: 01/04/21. Last JCV 08/10/20 negative, index: 0.27    HISTORY OF PRESENT ILLNESS:  Nancy Jennings is a 40 year old woman with relapsing remitting multiple sclerosis.  Update 12/20/2020: She is on Tysabri.   JCV Ab is negative 0.27 (08/10/2020)   She tolerates it well and has not had any definite exacerbations.      Gait is doing well.   Balance is fine.    Strength is normal.    She reports dysaesthetitic pain in the arms, L>R. Marland Kitchen  Pain radiates into the hands, left > right.   The 2nd and 3rd fingers are most involved when pain enters the hand.   Gabapentin has helped the arm pain.   She takes 200 mg po tid.   She denies definite weakness in her arms and legs.      She is on nortriptyline 30 mg nightly.  Gabapentin makes her dizzy even at 300 mg at night.     She is having more headaches, almost always awakening her at night. She drinks a lot of caffeine but no changes over the last few months. They are located frontally bilaterally.   Advil helps (800 mg).   She does not have nausea, photophobia or phonophobia.     She notes fatigue.   She has gained 40 pounds since Covid pandemic.    She has done the vaccinations and is planning to do the booster.  She had Covid-19 in November 2020.    Last JCV was 08/10/20 and was negative at 0.27.      MS History: She was diagnosed with MS in late 2019.  In September 2019, she had the onset of left neck pain and then had numbness and pain that went down the left arm and then right arm and then right leg.   Symptoms evolved over 1-2 days.   Due to discomfort, she had trouble sleeping.   She saw her PCP a few days later.   She was referred her to [facility] and saw Dr. [name].   Her symptoms had already resolved over 2-3 weeks but she continued to have painful tingling in the left arm that persisted.   She saw Dr. [name] in October and had a NCV/EMG in [place].     Due to continued symptoms, she had a brain MRI.    It was consistent with MS and an MRI of the cervical spine was also ordered.   It showed a lesion to the left at C2 and a second lesion ventral midline at C5C6.   Her MRIs were done at [facility].  She was started on  Tysabri.   She is JCV Ab negative.    MRI 1/102/20:  Cervical spinal cord: Ovoid, slightly expansile T2-hyperintense lesion in the left side of the spinal cord at the C2 vertebral level, measuring approximately 13 mm craniocaudal. Similar smaller, more subtle lesion in the ventral midline spinal cord at C6  MRI 10/16/2018 Brain: Multiple (greater than 10) T2/FLAIR-hyperintense white matter lesions in both cerebral hemispheres, both periventricular and juxtacortical, as well as, to a  lesser extent, in both cerebellar hemispheres.  The brain volume is normal.   Incidental right frontal developmental venous anomaly.  MRI 10/18/2020 showed multiple T2/FLAIR hyperintense foci in the hemispheres and also a focus in the left cerebellar hemisphere and in the spinal cord.  These are consistent with chronic demyelinating plaque associated with multiple sclerosis.  None of the foci appears to be acute.  However, one focus in the left frontal juxtacortical white matter was not present on the previous MRI and likely occurred during the 1 year interval.   REVIEW OF SYSTEMS: Constitutional: No fevers, chills, sweats, or change in appetite.  She notes fatigue. Eyes: No visual changes, double vision, eye pain Ear, nose and throat: No hearing loss, ear pain, nasal congestion, sore  throat Cardiovascular: No chest pain, palpitations Respiratory: No shortness of breath at rest or with exertion.   No wheezes GastrointestinaI: No nausea, vomiting, diarrhea, abdominal pain, fecal incontinence Genitourinary: No dysuria, urinary retention or frequency.  No nocturia. Musculoskeletal: No neck pain, back pain Integumentary: No rash, pruritus, skin lesions Neurological: as above Psychiatric: No depression at this time.  No anxiety Endocrine: No palpitations, diaphoresis, change in appetite, change in weigh or increased thirst Hematologic/Lymphatic: No anemia, purpura, petechiae. Allergic/Immunologic: No itchy/runny eyes, nasal congestion, recent allergic reactions, rashes  ALLERGIES: No Known Allergies  HOME MEDICATIONS:  Current Outpatient Medications:    Cholecalciferol (VITAMIN D3) 50 MCG (2000 UT) capsule, Take 2,000 Units by mouth daily. , Disp: , Rfl:    gabapentin (NEURONTIN) 100 MG capsule, Take up to 6 pills a day, Disp: 180 capsule, Rfl: 5   Ibuprofen (ADVIL PO), Take by mouth., Disp: , Rfl:    Loratadine 10 MG CAPS, Take by mouth., Disp: , Rfl:    natalizumab (TYSABRI) 300 MG/15ML injection, Inject 300 mg into the vein every 28 (twenty-eight) days., Disp: , Rfl:    nortriptyline (PAMELOR) 10 MG capsule, Take 2 or 3 capsules po qHS, Disp: 90 capsule, Rfl: 11   zonisamide (ZONEGRAN) 100 MG capsule, Take 1 capsule (100 mg total) by mouth daily., Disp: 90 capsule, Rfl: 3  PAST MEDICAL HISTORY: Past Medical History:  Diagnosis Date   Multiple sclerosis (HCC)    Neuromuscular disorder (HCC)     PAST SURGICAL HISTORY: Past Surgical History:  Procedure Laterality Date   DILATION AND CURETTAGE OF UTERUS     FRACTURE SURGERY  02/2018   left ankle    FAMILY HISTORY: No family history on file.  SOCIAL HISTORY:  Social History   Socioeconomic History   Marital status: Married    Spouse name: Thereasa Distance   Number of children: Not on file    Years of education: Not on file   Highest education level: Not on file  Occupational History   Occupation: [employer]   Tobacco Use   Smoking status: Never Smoker   Smokeless tobacco: Never Used  Substance and Sexual Activity   Alcohol use: Yes    Alcohol/week: 1.0 standard drink    Types: 1 Shots of liquor per week    Comment: rarely - about 1x a month   Drug use: Never   Sexual activity: Not on file  Other Topics Concern   Not on file  Social History Narrative   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   Right handed    Lives with husband   Social Determinants of Health   Financial Resource Strain: Not on file  Food Insecurity: Not on file  Transportation Needs: Not on file  Physical Activity: Not on file  Stress: Not on file  Social Connections: Not on file  Intimate Partner Violence: Not on file     PHYSICAL EXAM  Vitals:   12/20/20 1258  BP: 135/85  Pulse: 96  SpO2: 95%  Weight: (!) 339 lb (153.8 kg)  Height: 5\' 8"  (1.727 m)    Body mass index is 51.54 kg/m.   General: The patient is well-developed and well-nourished and in no acute distress.  Neck has good range of motion.  HEENT:  Head is /AT.  Sclera are anicteric.      Skin: Extremities are without rash or  edema.  Neurologic Exam  Mental status: The patient is alert and oriented x 3 at the time of the examination. The patient has apparent normal recent and remote memory, with an apparently normal attention span and concentration ability.   Speech is normal.  Cranial nerves: Extraocular movements are full.  .  Facial symmetry is present. There is good facial sensation to soft touch bilaterally.Facial strength is normal.  Trapezius and sternocleidomastoid strength is normal. No dysarthria is noted.  No obvious hearing deficits are noted.  Motor:  Muscle bulk is normal.   Tone is normal. Strength is  5 / 5 in all 4 extremities.   Sensory: On sensory exam, she has mildly reduced sensation to temperature and  touch in the right arm and leg relative to the left but symmetric sensation to vibration  Coordination: Cerebellar testing reveals good finger-nose-finger and heel-to-shin bilaterally.  Gait and station: Station is normal.   Gait is normal.  The tandem gait is wide.. Romberg is negative.   Reflexes: Deep tendon reflexes are symmetric and normal bilaterally.      DIAGNOSTIC DATA (LABS, IMAGING, TESTING) - I reviewed patient records, labs, notes, testing and imaging myself where available.  Lab Results  Component Value Date   WBC 5.6 08/10/2020   HGB 12.5 08/10/2020   HCT 38.6 08/10/2020   MCV 78 (L) 08/10/2020   PLT 235 08/10/2020       ASSESSMENT AND PLAN  1. Multiple sclerosis (HCC)   2. Bilateral arm pain   3. High risk medication use   4. Numbness and tingling in left arm   5. Insomnia, unspecified type   6. Other headache syndrome     1.   Continue Tysabri.   Recheck JCV Ab  2.   Continue  gabapentin 200 mg po tid  3.   To determine if there is breakthrough activity, check MRI brain and cervical spine.  Consider a different DMT if this is occurring.   4.   Zonisamide for headaches (non-migrainous)  --- if not better chnge to meloxicam 15 mg.   5.   RTC 6 months or sooner if new or worsening   Annabelle Rexroad A. 08/12/2020, MD, PhD, FAAN Certified in Neurology, Clinical Neurophysiology, Sleep Medicine, Pain Medicine and Neuroimaging Director, [facility]  [facility] [address], [address] [place], [place] [phone]  Addendum:

## 2020-12-20 NOTE — Telephone Encounter (Signed)
Placed JCV lab in [facility] lock box for routine lab pick up. Results pending. 

## 2020-12-21 LAB — CBC WITH DIFFERENTIAL/PLATELET
Basophils Absolute: 0 10*3/uL (ref 0.0–0.2)
Basos: 1 %
EOS (ABSOLUTE): 0.3 10*3/uL (ref 0.0–0.4)
Eos: 4 %
Hematocrit: 40.8 % (ref 34.0–46.6)
Hemoglobin: 12.8 g/dL (ref 11.1–15.9)
Immature Grans (Abs): 0.1 10*3/uL (ref 0.0–0.1)
Immature Granulocytes: 1 %
Lymphocytes Absolute: 2.4 10*3/uL (ref 0.7–3.1)
Lymphs: 32 %
MCH: 24.8 pg — ABNORMAL LOW (ref 26.6–33.0)
MCHC: 31.4 g/dL — ABNORMAL LOW (ref 31.5–35.7)
MCV: 79 fL (ref 79–97)
Monocytes Absolute: 0.6 10*3/uL (ref 0.1–0.9)
Monocytes: 8 %
NRBC: 1 % — ABNORMAL HIGH (ref 0–0)
Neutrophils Absolute: 4.1 10*3/uL (ref 1.4–7.0)
Neutrophils: 54 %
Platelets: 271 10*3/uL (ref 150–450)
RBC: 5.17 x10E6/uL (ref 3.77–5.28)
RDW: 14.4 % (ref 11.7–15.4)
WBC: 7.5 10*3/uL (ref 3.4–10.8)

## 2020-12-25 ENCOUNTER — Telehealth: Payer: Self-pay | Admitting: Neurology

## 2020-12-25 NOTE — Telephone Encounter (Signed)
MRI Brain WO and MRI Cervical WO 60 mins Dr. Epimenio Foot [facility] Berkley Harvey: [phone] exp: 12/21/20-02/04/21 and Berkley Harvey: [id number] exp 12/22/20-02/05/21 PT scheduled at [facility] 12/26/20

## 2020-12-26 ENCOUNTER — Ambulatory Visit: Payer: 59

## 2020-12-26 DIAGNOSIS — G35 Multiple sclerosis: Secondary | ICD-10-CM

## 2020-12-26 DIAGNOSIS — M79601 Pain in right arm: Secondary | ICD-10-CM

## 2020-12-28 ENCOUNTER — Telehealth: Payer: Self-pay | Admitting: *Deleted

## 2020-12-28 NOTE — Telephone Encounter (Addendum)
JCV ab drawn on 12/20/20 indeterminate, index: 0.24. Inhibition assay: negative.

## 2020-12-28 NOTE — Telephone Encounter (Signed)
Faxed completed/signed Tysabri pt status report and reauth questionnaire to [facility] touch at [phone]. Received confirmation.   Received fax from touch prescribing program that pt re-authorized for Tysabri from 12/28/20-07/29/21. Patient enrollment number: [id number]. Account:[facility]. Site auth number: [id number].

## 2020-12-29 ENCOUNTER — Other Ambulatory Visit: Payer: Self-pay | Admitting: Neurology

## 2021-06-12 IMAGING — MG DIGITAL SCREENING BILAT W/ TOMO W/ CAD
6 of 10 series · 6 of 30 positions shown · non-contrast
Comparison: Previous exam(s).

CLINICAL DATA: Screening.

EXAM:
DIGITAL SCREENING BILATERAL MAMMOGRAM WITH TOMO AND CAD

[L MLO synth-2D]
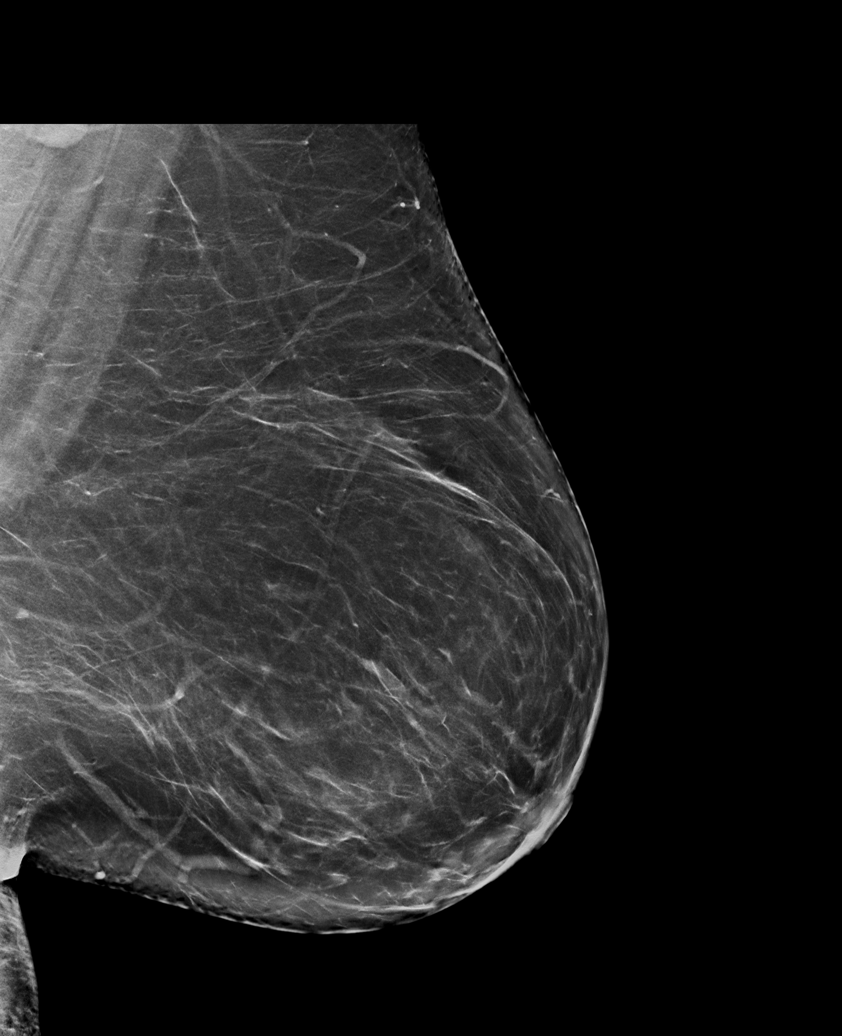

[L CC synth-2D]
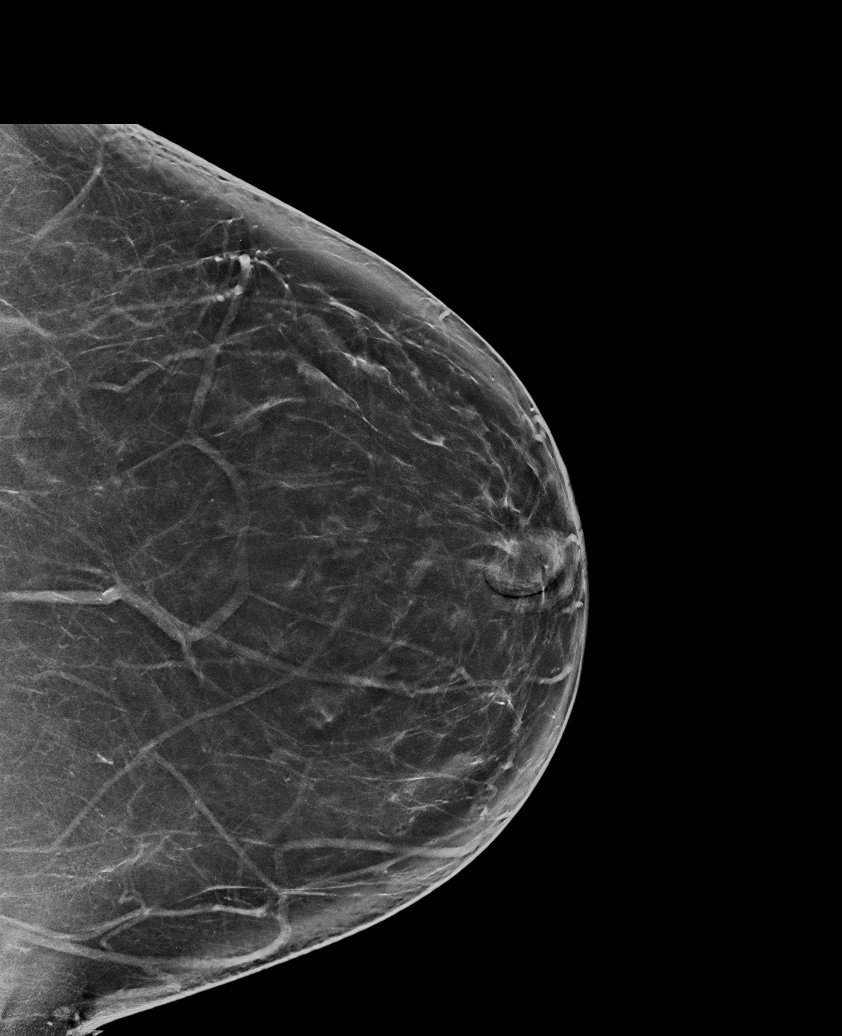

[R CC synth-2D]
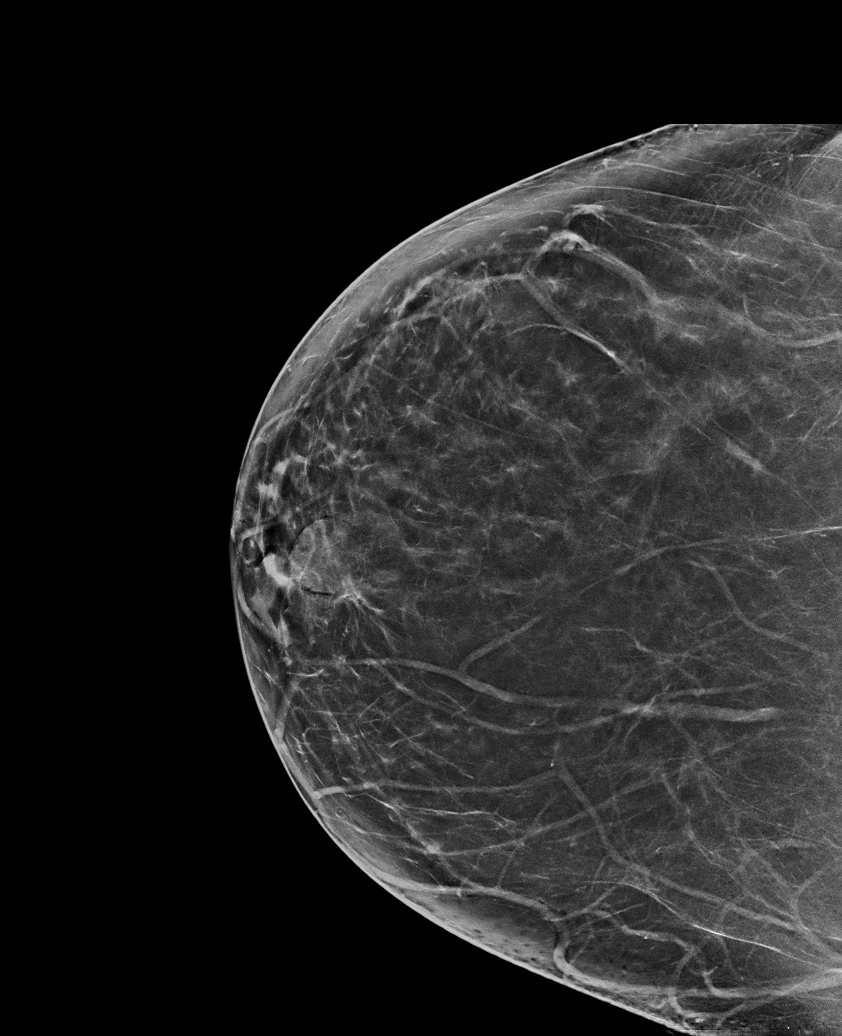

[L CV synth-2D]
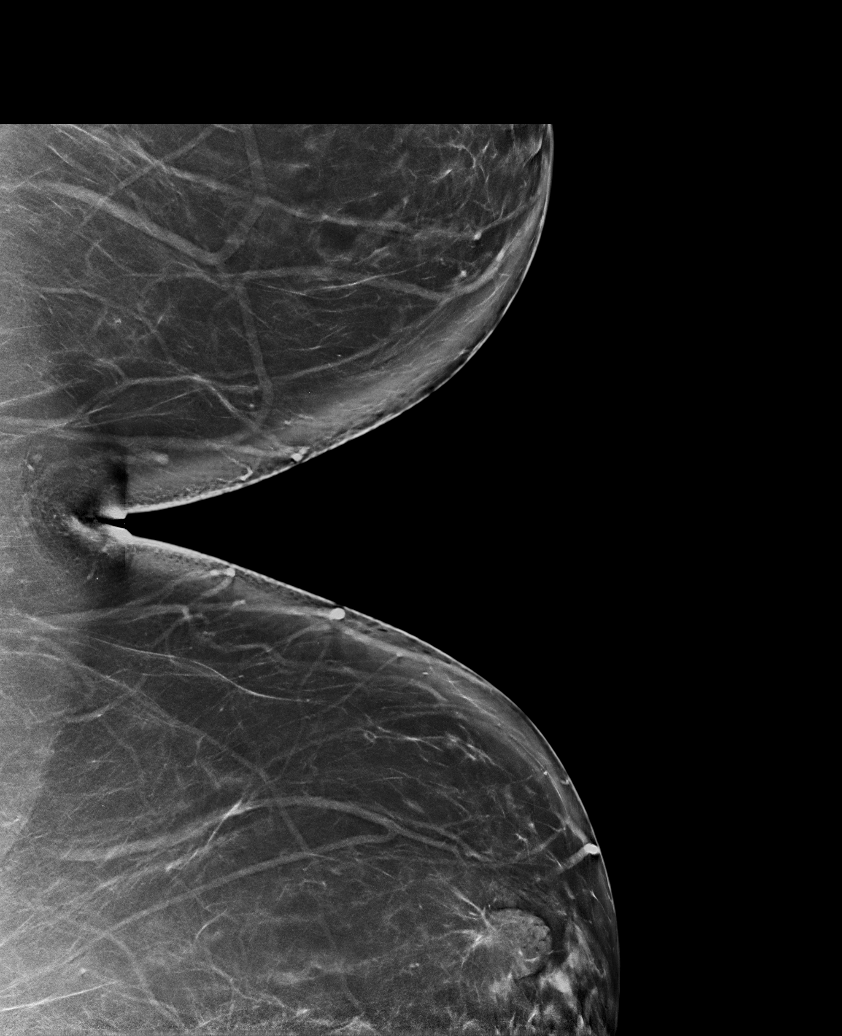

[R MLO synth-2D]
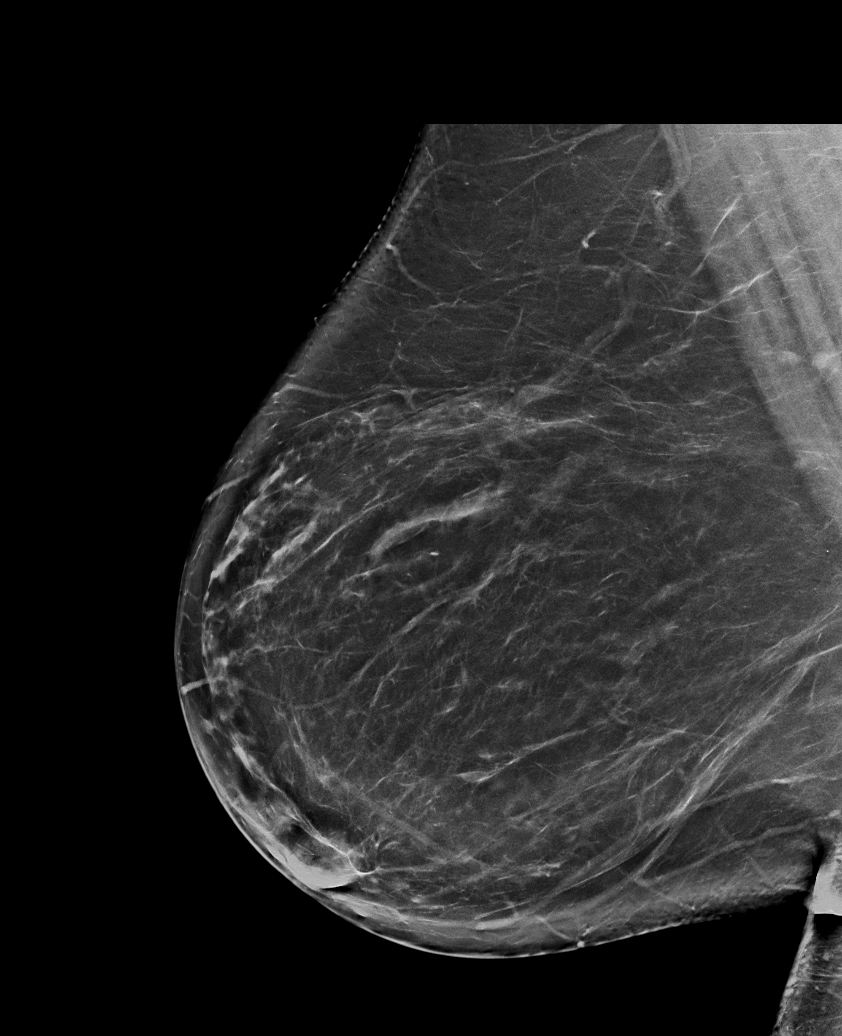

[R CC tomo · tomo slice 42/83.0]
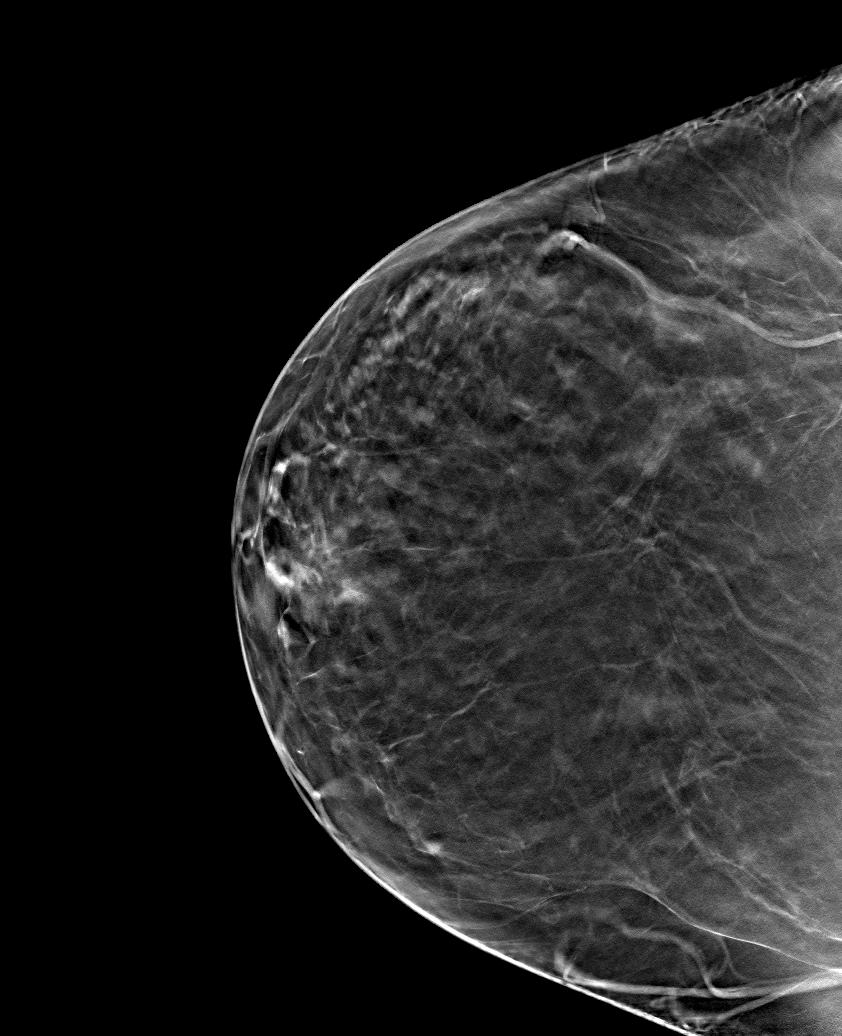

[6 of 30 positions shown; findings below may reference images not displayed]

ACR Breast Density Category b: There are scattered areas of
fibroglandular density.
FINDINGS: There are no findings suspicious for malignancy. Images were
processed with CAD.
IMPRESSION: No mammographic evidence of malignancy. A result letter of this
screening mammogram will be mailed directly to the patient.

RECOMMENDATION:
Screening mammogram in one year. (Code:[id number])

BI-RADS CATEGORY  1: Negative.

## 2021-06-21 ENCOUNTER — Other Ambulatory Visit: Payer: 59

## 2021-06-21 ENCOUNTER — Telehealth: Payer: Self-pay | Admitting: *Deleted

## 2021-06-21 ENCOUNTER — Other Ambulatory Visit: Payer: Self-pay | Admitting: *Deleted

## 2021-06-21 DIAGNOSIS — G35 Multiple sclerosis: Secondary | ICD-10-CM

## 2021-06-21 DIAGNOSIS — Z79899 Other long term (current) drug therapy: Secondary | ICD-10-CM

## 2021-06-21 NOTE — Telephone Encounter (Signed)
Placed JCV lab in [facility] lock box for routine lab pick up. Results pending. 

## 2021-06-22 LAB — CBC WITH DIFFERENTIAL/PLATELET
Basophils Absolute: 0.1 10*3/uL (ref 0.0–0.2)
Basos: 1 %
EOS (ABSOLUTE): 0.3 10*3/uL (ref 0.0–0.4)
Eos: 5 %
Hematocrit: 38.9 % (ref 34.0–46.6)
Hemoglobin: 12.4 g/dL (ref 11.1–15.9)
Immature Grans (Abs): 0.1 10*3/uL (ref 0.0–0.1)
Immature Granulocytes: 1 %
Lymphocytes Absolute: 2.3 10*3/uL (ref 0.7–3.1)
Lymphs: 35 %
MCH: 25.3 pg — ABNORMAL LOW (ref 26.6–33.0)
MCHC: 31.9 g/dL (ref 31.5–35.7)
MCV: 79 fL (ref 79–97)
Monocytes Absolute: 0.5 10*3/uL (ref 0.1–0.9)
Monocytes: 8 %
NRBC: 1 % — ABNORMAL HIGH (ref 0–0)
Neutrophils Absolute: 3.3 10*3/uL (ref 1.4–7.0)
Neutrophils: 50 %
Platelets: 227 10*3/uL (ref 150–450)
RBC: 4.91 x10E6/uL (ref 3.77–5.28)
RDW: 14.8 % (ref 11.7–15.4)
WBC: 6.6 10*3/uL (ref 3.4–10.8)

## 2021-06-26 NOTE — Progress Notes (Addendum)
Chief Complaint  Patient presents with   Follow-up    RM 1, alone. Here for 6 month MS f/u, pt reports being stable. Continues to have HA, fatigue, and stress.      HISTORY OF PRESENT ILLNESS: 06/27/21 ALL:  Nancy Jennings is a 40 y.o. female here today for follow up for RRMS. She continues Tysabri infusions monthly. Last infusion 06/22/2021. JCV 0.27 in 07/2020, drawn yesterday and awaiting results. Last MRI brain stable and cervical spine showed possible very small focus posterolaterally to left adjacent to C6 but no definite changes from prior imaging in 2020.  She feels that she is still "about the same". She denies changes in gait. She is active at work. 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  She continues gabapentin 200 for left arm dysesthesias. It does seem to help. She was started on nortriptyline 20-30mg  daily for nerve pain but has been continued for headaches as well. She was started on zonisamide 100mg  daily for headache prevention in March. She is not sure if it is helping. She has a headache at least 12-16 headache days a month. Usually generalized achy pain. No light or sound sensitivity. 2-3 a month will wake her up from sleep. She usually takes Advil 800mg  once a day 4-5 times a week. It usually helps. BP has been up and down. He father had OSA.   She continues to have fatigue. She is sleeping ok. She feels like she rests well and wakes refreshed when she sleeps long enough. She does not snore.     HISTORY (copied from Dr April previous note)  12/20/2020: She is on Tysabri.   JCV Ab is negative 0.27 (08/10/2020)   She tolerates it well and has not had any definite exacerbations.       Gait is doing well.   Balance is fine.    Strength is normal.    She reports dysaesthetitic pain in the arms, L>R. 02/19/2021  Pain radiates into the hands, left > right.   The 2nd and 3rd fingers are most involved when pain enters the hand.   Gabapentin has helped the arm  pain.   She takes 200 mg po tid.   She denies definite weakness in her arms and legs.       She is on nortriptyline 30 mg nightly.  Gabapentin makes her dizzy even at 300 mg at night.      She is having more headaches, almost always awakening her at night. She drinks a lot of caffeine but no changes over the last few months. They are located frontally bilaterally.   Advil helps (800 mg).   She does not have nausea, photophobia or phonophobia.      She notes fatigue.   She has gained 40 pounds since Covid pandemic.    She has done the vaccinations and is planning to do the booster.  She had Covid-19 in November 2020.     Last JCV was 08/10/20 and was negative at 0.27.      MS History: She was diagnosed with MS in late 2019.  In September 2019, she had the onset of left neck pain and then had numbness and pain that went down the left arm and then right arm and then right leg.  Symptoms evolved over 1-2 days.   Due to discomfort, she had trouble sleeping.   She saw her PCP a few days later.   She was referred her to [facility]  [facility]urology and saw Dr. [name].   Her symptoms had already resolved over 2-3 weeks but she continued to have painful tingling in the left arm that persisted.   She saw Dr. [name] in October and had a NCV/EMG in [place].     Due to continued symptoms, she had a brain MRI.    It was consistent with MS and an MRI of the cervical spine was also ordered.   It showed a lesion to the left at C2 and a second lesion ventral midline at C5C6.   Her MRIs were done at [facility].  She was started on  Tysabri.   She is JCV Ab negative.    MRI 1/102/20:  Cervical spinal cord: Ovoid, slightly expansile T2-hyperintense lesion in the left side of the spinal cord at the C2 vertebral level, measuring approximately 13 mm craniocaudal. Similar smaller, more subtle lesion in the ventral midline spinal cord at C6   MRI 10/16/2018 Brain: Multiple (greater than 10) T2/FLAIR-hyperintense white matter  lesions in both cerebral hemispheres, both periventricular and juxtacortical, as well as, to a lesser extent, in both cerebellar hemispheres.  The brain volume is normal.   Incidental right frontal developmental venous anomaly.   MRI 10/18/2020 showed multiple T2/FLAIR hyperintense foci in the hemispheres and also a focus in the left cerebellar hemisphere and in the spinal cord.  These are consistent with chronic demyelinating plaque associated with multiple sclerosis.  None of the foci appears to be acute.  However, one focus in the left frontal juxtacortical white matter was not present on the previous MRI and likely occurred during the 1 year interval.   REVIEW OF SYSTEMS: Out of a complete 14 system review of symptoms, the patient complains only of the following symptoms, dysesthesias, fatigue, headaches and all other reviewed systems are negative.   ALLERGIES: No Known Allergies   HOME MEDICATIONS: Outpatient Medications Prior to Visit  Medication Sig Dispense Refill   Cholecalciferol (VITAMIN D3) 50 MCG (2000 UT) capsule Take 2,000 Units by mouth daily.      gabapentin (NEURONTIN) 100 MG capsule TAKE UP TO 6 CAPSULES BY MOUTH EVERY DAY 180 capsule 5   Ibuprofen (ADVIL PO) Take by mouth.     Loratadine 10 MG CAPS Take by mouth.     natalizumab (TYSABRI) 300 MG/15ML injection Inject 300 mg into the vein every 28 (twenty-eight) days.     nortriptyline (PAMELOR) 10 MG capsule Take 2 or 3 capsules po qHS 90 capsule 11   zonisamide (ZONEGRAN) 100 MG capsule Take 1 capsule (100 mg total) by mouth daily. 90 capsule 3   No facility-administered medications prior to visit.     PAST MEDICAL HISTORY: Past Medical History:  Diagnosis Date   Multiple sclerosis (HCC)    Neuromuscular disorder (HCC)      PAST SURGICAL HISTORY: Past Surgical History:  Procedure Laterality Date   DILATION AND CURETTAGE OF UTERUS     FRACTURE SURGERY  02/2018   left ankle     FAMILY HISTORY: History  reviewed. No pertinent family history.   SOCIAL HISTORY: Social History   Socioeconomic History   Marital status: Married    Spouse name: Thereasa Distance   Number of children: Not on file   Years of education: Not on file   Highest education level: Not on file  Occupational History   Occupation: [employer]   Tobacco Use   Smoking status: Never   Smokeless tobacco: Never  Substance and Sexual Activity   Alcohol  use: Yes    Alcohol/week: 1.0 standard drink    Types: 1 Shots of liquor per week    Comment: rarely - about 1x a month   Drug use: Never   Sexual activity: Not on file  Other Topics Concern   Not on file  Social History Narrative   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   Right handed    Lives with husband   Social Determinants of Health   Financial Resource Strain: Not on file  Food Insecurity: Not on file  Transportation Needs: Not on file  Physical Activity: Not on file  Stress: Not on file  Social Connections: Not on file  Intimate Partner Violence: Not on file     PHYSICAL EXAM  Vitals:   06/27/21 1308  BP: 134/90  Pulse: 100  Weight: (!) 333 lb (151 kg)  Height: 5\' 8"  (1.727 m)   Body mass index is 50.63 kg/m.   Generalized: Well developed, in no acute distress  Cardiology: normal rate and rhythm, no murmur auscultated  Respiratory: clear to auscultation bilaterally    Neurological examination  Mentation: Alert oriented to time, place, history taking. Follows all commands speech and language fluent Cranial nerve II-XII: Pupils were equal round reactive to light. Extraocular movements were full, visual field were full on confrontational test. Facial sensation and strength were normal. Head turning and shoulder shrug  were normal and symmetric. Motor: The motor testing reveals 5 over 5 strength of all 4 extremities. Good symmetric motor tone is noted throughout.  Sensory: Sensory testing is intact to soft touch on all 4 extremities. No evidence of extinction is noted.   Coordination: Cerebellar testing reveals good finger-nose-finger and heel-to-shin bilaterally.  Gait and station: Gait is normal.      DIAGNOSTIC DATA (LABS, IMAGING, TESTING) - I reviewed patient records, labs, notes, testing and imaging myself where available.  Lab Results  Component Value Date   WBC 6.6 06/21/2021   HGB 12.4 06/21/2021   HCT 38.9 06/21/2021   MCV 79 06/21/2021   PLT 227 06/21/2021   No results found for: NA, K, CL, CO2, GLUCOSE, BUN, CREATININE, CALCIUM, PROT, ALBUMIN, AST, ALT, ALKPHOS, BILITOT, GFRNONAA, GFRAA No results found for: CHOL, HDL, LDLCALC, LDLDIRECT, TRIG, CHOLHDL No results found for: 08/21/2021 No results found for: VITAMINB12 No results found for: TSH  No flowsheet data found.   No flowsheet data found.   ASSESSMENT AND PLAN  40 y.o. year old female  has a past medical history of Multiple sclerosis (HCC) and Neuromuscular disorder (HCC). here with    Relapsing remitting multiple sclerosis (HCC)  High risk medication use  Numbness and tingling in left arm  Other headache syndrome  Delphina feels MS symptoms are stable, today. We will continue Tysabri infusions monthly. She had labs with last infusion last week. CBC unremarkable, awaiting JCV results. She will continue gabapentin, nortriptyline and zonisamide as prescribed. I will add meloxicam 15mg  daily for headache management. She was advised to avoid taking other NSAIDs while taking meloxicam. May try to wean zonisamide if meloxicam is effective as she is not sure it has helped much. She will focus on healthy lifestyle habits. She will follow up with Dr Victorino Dike in 6 months. She verbalizes understanding and agreement with this plan.    No orders of the defined types were placed in this encounter.    Meds ordered this encounter  Medications   meloxicam (MOBIC) 15 MG tablet    Sig: Take 1 tablet (15  mg total) by mouth daily.    Dispense:  90 tablet    Refill:  3    Order Specific  Question:   Supervising Provider    Answer:   Anson Fret [[id number]]       Shawnie Dapper, MSN, FNP-C 06/27/2021, 3:44 PM  [facility] [address] [place], [place] [address] [phone]   I have read the note, and I agree with the clinical assessment and plan.  Richard A. Epimenio Foot, MD, PhD, [facility] Certified in Neurology, Clinical Neurophysiology, Sleep Medicine, Pain Medicine and Neuroimaging  [facility] [address] [place], [place] [address] [phone]

## 2021-06-27 ENCOUNTER — Ambulatory Visit (INDEPENDENT_AMBULATORY_CARE_PROVIDER_SITE_OTHER): Payer: 59 | Admitting: Family Medicine

## 2021-06-27 ENCOUNTER — Other Ambulatory Visit: Payer: Self-pay | Admitting: Neurology

## 2021-06-27 ENCOUNTER — Encounter: Payer: Self-pay | Admitting: Family Medicine

## 2021-06-27 VITALS — BP 134/90 | HR 100 | Ht 68.0 in | Wt 333.0 lb

## 2021-06-27 DIAGNOSIS — G35 Multiple sclerosis: Secondary | ICD-10-CM | POA: Diagnosis not present

## 2021-06-27 DIAGNOSIS — G4489 Other headache syndrome: Secondary | ICD-10-CM

## 2021-06-27 DIAGNOSIS — Z79899 Other long term (current) drug therapy: Secondary | ICD-10-CM

## 2021-06-27 DIAGNOSIS — R200 Anesthesia of skin: Secondary | ICD-10-CM | POA: Diagnosis not present

## 2021-06-27 DIAGNOSIS — R202 Paresthesia of skin: Secondary | ICD-10-CM

## 2021-06-27 MED ORDER — MELOXICAM 15 MG PO TABS: 15.0000 mg | ORAL_TABLET | Freq: Every day | ORAL | 3 refills | Status: DC

## 2021-06-27 NOTE — Patient Instructions (Signed)
Below is our plan:  We will continue Tysabri infusions every 30 days. We will look for JCV results. CBC results look good. Continue gabapentin, nortriptyline and zonisamide. I will add meloxicam 15mg  daily. Take this for 2-3 weeks every day. Do not take Advil or Aleve. Tylenol will be ok to take with meloxicam if needed. If headaches improve, consider every other day dosing of meloxicam. Consider sleep study if are worried about sleep apnea. Continue to monitor BP at home.   Please make sure you are staying well hydrated. I recommend 50-60 ounces daily. Well balanced diet and regular exercise encouraged. Consistent sleep schedule with 6-8 hours recommended.   Please continue follow up with care team as directed.   Follow up with Dr in 6 months   You may receive a survey regarding today's visit. I encourage you to leave honest feed back as I do use this information to improve patient care. Thank you for seeing me today!

## 2021-07-04 NOTE — Telephone Encounter (Signed)
Received the JCV result. This came back at 0.24 which is inderterminate. This is the same result when last assessed in march. Will inform Dr Epimenio Foot

## 2021-08-02 ENCOUNTER — Other Ambulatory Visit: Payer: Self-pay

## 2021-08-02 ENCOUNTER — Encounter: Payer: 59 | Attending: Certified Nurse Midwife | Admitting: Dietician

## 2021-08-02 ENCOUNTER — Encounter: Payer: Self-pay | Admitting: Dietician

## 2021-08-02 VITALS — Ht 68.0 in | Wt 332.7 lb

## 2021-08-02 DIAGNOSIS — R7303 Prediabetes: Secondary | ICD-10-CM | POA: Diagnosis present

## 2021-08-02 NOTE — Patient Instructions (Addendum)
Call [facility] Pastoral care dept at [phone] if you would like to meet with a counselor. Make sure to let them know that you are in the nutrition program and that I referred you to them.  Incorporate some "moving" breaks during study/ schoolwork times. This can burn more calories, control blood sugar, manage stress, and help with focus on school work as well. Increase low carb vegetables in meals. Fresh, frozen, or canned are all fine.  Plan to have a veg and/or a fruit with each meal.

## 2021-08-02 NOTE — Progress Notes (Signed)
Medical Nutrition Therapy: Visit start time: 1330  end time: 1430  Assessment:  Diagnosis: pre-diabetes Past medical history: MS Psychosocial issues/ stress concerns: high stress level, feels she is not dealing well with stress; does emotional eating  Preferred learning method:  Hands-on   Current weight: 332.7lbs Height: 5'8" BMI: 50.59 Medications, supplements: reconciled list in medical record  Progress and evaluation:  Patient reports losing weight on packaged-meal type diet in early 2019; lost down to about 260lbs at that time. Unfortunately this was followed by stressful life events, and weight increased again.  When at 260lbs, her BG control was back to normal; returned to pre-diabetes level with weight regain. Recent lab results indicate HbA1C of 6.2% (04/10/21) Mother has Type 2 DM; husband also has T2DM.   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 Dining out frequency: 3-4 meals per week.  Breakfast: granola bar or chobani flip yogurt Snack: string cheese/ colby jack; granola bar; likes chocolate -- eats mini serving Lunch: sometimes leftovers Snack: same as am Supper: 10/12 dirty rice with sausage (not balanced per patient) has tried blue apron meals; often burger or takeout about 2x a week -- wings; pizza Snack: occasional sweet treat ie recently sweet potato pie Beverages: water 2-3L daily; sugar free diet pepsi or Mt dew  Nutrition Care Education: Topics covered:  Basic nutrition: basic food groups, appropriate nutrient balance, appropriate meal and snack schedule, general nutrition guidelines    Weight control: importance of low sugar and low fat choices; portion control; incorporating low carb  vegetables to promote satiety while controlling portions of starches and meats; discussed options for physical activity; options for ongoing support and accountability Advanced nutrition:  meal planning/ prepping; batch cooking Pre-diabetes:  goals for BGs; appropriate meal and snack schedule, appropriate carb intake and balance, healthy carb choices, role of fiber, protein, fat; role of physical activity   Nutritional Diagnosis:  [place] -2.2 Altered nutrition-related laboratory As related to pre-diabetes.  As evidenced by elevated HbA1C. [place]-3.3 Overweight/obesity As related to stress, excess calories, inadequate physical activity in part related to MS pain.  As evidenced by patient with current BMI of 50.59.  Intervention:  Instruction and discussion as noted above. Patient has been working on changes to decrease sugar intake, increase water. She is motivated to continue within limited time schedule. Established goals for additional change with direction from patient.  Education Materials given:  [employer] guidelines for Diabetes Plate Planner with food lists, sample meal pattern Sample menus Build a Pyramid Skillet Meal Visit summary with goals/ instructions to be viewed via MyChart   Learner/ who was taught:  Patient   Level of understanding: Verbalizes/ demonstrates competency   Demonstrated degree of understanding via:   Teach back Learning barriers: None  Willingness to learn/ readiness for change: Eager, change in progress   Monitoring and Evaluation:  Dietary intake, exercise, BG control, and body weight      follow up:  09/19/21 at 1:30pm

## 2021-08-12 ENCOUNTER — Other Ambulatory Visit: Payer: Self-pay | Admitting: Neurology

## 2021-09-19 ENCOUNTER — Encounter: Payer: Self-pay | Admitting: Dietician

## 2021-09-19 ENCOUNTER — Other Ambulatory Visit: Payer: Self-pay

## 2021-09-19 ENCOUNTER — Encounter: Payer: 59 | Attending: Certified Nurse Midwife | Admitting: Dietician

## 2021-09-19 VITALS — Ht 68.0 in | Wt 326.8 lb

## 2021-09-19 DIAGNOSIS — R7303 Prediabetes: Secondary | ICD-10-CM | POA: Diagnosis present

## 2021-09-19 NOTE — Patient Instructions (Signed)
Continue current eating pattern, awesome job!

## 2021-09-19 NOTE — Progress Notes (Signed)
Medical Nutrition Therapy: Visit start time: 1330  end time: 1400  Assessment:  Diagnosis: pre-diabetes Medical history changes: no changes Psychosocial issues/ stress concerns: high stress level  Current weight: 326.8lbs Height: 5'8" BMI: 49.69 Medications, supplement changes: no changes per patient  Progress and evaluation:  Weight loss of about 6lbs since previous visit on 08/02/21, including Thanksgiving holiday. Patient reports working on meal prep strategies for breakfast, lunches, and dinners.  She states she is feeling better overall with changes in eating habits. She has not done any emotional eating recently; more meals eaten at home   Physical activity: some on the job activity, works 40+ hours weekly  Dietary Intake:  Usual eating pattern includes 2-3 meals and 1-2 snacks per day. Dining out frequency: 1-2 meals per week.  Breakfast: 2 scrambled eggs, sweet potato/ broccoli; overnight oats Snack: none, often eats breakfast during break at work Lunch: chicken and peppers and onions/ roasted brussels sprouts/ other veg on sheet pan; salad with egg bacon, tomato, sunflower seeds; this week skipped 2x due to busy work day Snack: apple/ cucumbers/ grapes/ less granola Supper: mostly at home recently - rarely grilled chicken sandwich; acorn squash with brown rice and ground [place] in bowl; bell pepper stuffed with taco meat and rice Snack: none or same as pm Beverages: water, sugar free soda  Nutrition Care Education: Topics covered:      Weight control: reviewed progress since previous visit; discussed managing eating during holidays Diabetes prevention:  role of physical activity; appropriate nutritional balance in meals   Nutritional Diagnosis:  [place]-2.2 Altered nutrition-related laboratory As related to pre-diabetes.  As evidenced by elevated HbA1C. [place]-3.3 Overweight/obesity As related to stress, excess calories, inadequate physical activity in part related to MS pain.  As  evidenced by patient with current BMI of 49.69, making diet and lifestyle changes to promote ongoing weight loss and reduce health risk.  Intervention:  Instruction and discussion as noted above. Patient has made significant changes to improve diet and prevent diabetes. She is motivated to continue.  Current goals is to maintain her current healthy eating pattern.  Education Materials given:  Visit summary with goals/ instructions   Learner/ who was taught:  Patient   Level of understanding: Verbalizes/ demonstrates competency   Demonstrated degree of understanding via:   Teach back Learning barriers: None  Willingness to learn/ readiness for change: Eager, change in progress   Monitoring and Evaluation:  Dietary intake, exercise, BG control, and body weight      follow up:  10/19/21 at 9:45am

## 2021-10-19 ENCOUNTER — Other Ambulatory Visit: Payer: Self-pay

## 2021-10-19 ENCOUNTER — Encounter: Payer: Self-pay | Admitting: Dietician

## 2021-10-19 ENCOUNTER — Encounter: Payer: 59 | Attending: Certified Nurse Midwife | Admitting: Dietician

## 2021-10-19 VITALS — Ht 68.0 in | Wt 320.3 lb

## 2021-10-19 DIAGNOSIS — R7303 Prediabetes: Secondary | ICD-10-CM | POA: Insufficient documentation

## 2021-10-19 NOTE — Patient Instructions (Signed)
Continue to follow healthy and balanced eating pattern, great job! Resume some exercise as able.

## 2021-10-19 NOTE — Progress Notes (Signed)
Medical Nutrition Therapy: Visit start time: 0945  end time: 1015  Assessment:  Diagnosis: pre-diabetes Medical history changes: recovering from respiratory illness Psychosocial issues/ stress concerns: none  Current weight: 320.3lbs Height: 5'8" BMI: 48.7 Medications, supplement changes: reconciled list in medical record  Progress and evaluation:  Patient reports respiratory illness for the psat several weeks (negative for COVID), poor appetite. Ate more soups, light meals, occ milkshake. Husband is also starting to work on healthier eating and exercise, ie including more vegetables with meals, etc. Did not eat many sweets over Christmas holiday due to decreased appetite and energy. Classwork is ending, will have capstone project to finish schooling in the next 2 months.   Physical activity: less recently due to illness  Dietary Intake:  Usual eating pattern includes 3 meals and 1-2 snacks per day. Dining out frequency: 1-2 meals per week.  Breakfast: recently egg sandwich on toast; planning to try wrap with egg, sausage, sweet potato Snack: none or granola bar Lunch: bowl meal with brown rice, black beans, chicken, lettuce; leftovers ham, peas, sm amt potatoes Snack: none or fruit Supper: chicken rice casserole from frozen (smaller portion than in the past), quick meals recently sloppy joe with green beans -- trying to include veg with each meal Snack: none Beverages: water, sugar free soda  Nutrition Care Education: Topics covered:     Weight control: reviewed progress since previous visit; benefits of family support; effects of stress; physical activity Pre-diabetes: benefits of weight loss; healthy carb foods   Nutritional Diagnosis:  [place]-2.2 Altered nutrition-related laboratory As related to pre-diabetes.  As evidenced by elevated HbA1C. [place]-3.3 Overweight/obesity As related to stress, excess calories, inadequate physical activity in part related to MS pain.  As evidenced by  patient with current BMI of 48.7, making diet and lifestyle changes to promote ongoing weight loss and reduce health risk.  Intervention:  Instruction and discussion as noted above. Patient continues with steady weight loss, maintaining healthy eating habits.  She has plans to resume gym workouts when school work has finished.  No new nutrition goals at this time, she will continue with current eating pattern.   Learner/ who was taught:  Patient    Level of understanding: Verbalizes/ demonstrates competency   Demonstrated degree of understanding via:   Teach back Learning barriers: None  Willingness to learn/ readiness for change: Eager, change in progress   Monitoring and Evaluation:  Dietary intake, exercise, BG control, and body weight      follow up:  12/14/21 at 9:00am

## 2021-12-08 ENCOUNTER — Other Ambulatory Visit: Payer: Self-pay | Admitting: Neurology

## 2021-12-14 ENCOUNTER — Ambulatory Visit: Payer: 59 | Admitting: Dietician

## 2021-12-14 ENCOUNTER — Other Ambulatory Visit: Payer: Self-pay | Admitting: Neurology

## 2021-12-17 ENCOUNTER — Telehealth: Payer: Self-pay | Admitting: *Deleted

## 2021-12-17 ENCOUNTER — Other Ambulatory Visit: Payer: Self-pay | Admitting: *Deleted

## 2021-12-17 ENCOUNTER — Other Ambulatory Visit (INDEPENDENT_AMBULATORY_CARE_PROVIDER_SITE_OTHER): Payer: Self-pay

## 2021-12-17 DIAGNOSIS — G35 Multiple sclerosis: Secondary | ICD-10-CM

## 2021-12-17 DIAGNOSIS — Z79899 Other long term (current) drug therapy: Secondary | ICD-10-CM

## 2021-12-17 DIAGNOSIS — Z0289 Encounter for other administrative examinations: Secondary | ICD-10-CM

## 2021-12-17 NOTE — Telephone Encounter (Signed)
Placed JCV lab in [facility] lock box for routine lab pick up. Results pending. 

## 2021-12-18 ENCOUNTER — Encounter: Payer: Self-pay | Admitting: Neurology

## 2021-12-18 LAB — CBC WITH DIFFERENTIAL/PLATELET
Basophils Absolute: 0.1 10*3/uL (ref 0.0–0.2)
Basos: 1 %
EOS (ABSOLUTE): 0.5 10*3/uL — ABNORMAL HIGH (ref 0.0–0.4)
Eos: 5 %
Hematocrit: 40 % (ref 34.0–46.6)
Hemoglobin: 12.4 g/dL (ref 11.1–15.9)
Immature Grans (Abs): 0.1 10*3/uL (ref 0.0–0.1)
Immature Granulocytes: 1 %
Lymphocytes Absolute: 2.2 10*3/uL (ref 0.7–3.1)
Lymphs: 23 %
MCH: 24.7 pg — ABNORMAL LOW (ref 26.6–33.0)
MCHC: 31 g/dL — ABNORMAL LOW (ref 31.5–35.7)
MCV: 80 fL (ref 79–97)
Monocytes Absolute: 0.5 10*3/uL (ref 0.1–0.9)
Monocytes: 5 %
Neutrophils Absolute: 6.1 10*3/uL (ref 1.4–7.0)
Neutrophils: 65 %
Platelets: 244 10*3/uL (ref 150–450)
RBC: 5.02 x10E6/uL (ref 3.77–5.28)
RDW: 14.9 % (ref 11.7–15.4)
WBC: 9.4 10*3/uL (ref 3.4–10.8)

## 2021-12-19 ENCOUNTER — Encounter: Payer: Self-pay | Admitting: Dietician

## 2021-12-19 ENCOUNTER — Other Ambulatory Visit: Payer: Self-pay

## 2021-12-19 MED ORDER — GABAPENTIN 100 MG PO CAPS: 100.0000 mg | ORAL_CAPSULE | Freq: Every day | ORAL | 0 refills | Status: DC | PRN

## 2021-12-19 NOTE — Telephone Encounter (Signed)
RX sent per pt request.  

## 2021-12-19 NOTE — Progress Notes (Signed)
Patient cancelled her MNT follow up appt for 2.24.23 and did not wish to reschedule at this time. Sent notification to referring provider. ?

## 2021-12-25 NOTE — Telephone Encounter (Signed)
JCV result came back as negative. The index value was 0.19.will inform Amy of the result.  ?

## 2022-01-01 ENCOUNTER — Ambulatory Visit: Payer: 59 | Admitting: Neurology

## 2022-03-04 ENCOUNTER — Other Ambulatory Visit: Payer: Self-pay | Admitting: Neurology

## 2022-03-14 ENCOUNTER — Encounter: Payer: Self-pay | Admitting: Hematology and Oncology

## 2022-03-14 ENCOUNTER — Ambulatory Visit (INDEPENDENT_AMBULATORY_CARE_PROVIDER_SITE_OTHER): Payer: 59 | Admitting: Neurology

## 2022-03-14 ENCOUNTER — Encounter: Payer: Self-pay | Admitting: Neurology

## 2022-03-14 VITALS — BP 150/95 | HR 89 | Ht 68.0 in | Wt 322.5 lb

## 2022-03-14 DIAGNOSIS — R200 Anesthesia of skin: Secondary | ICD-10-CM

## 2022-03-14 DIAGNOSIS — N3941 Urge incontinence: Secondary | ICD-10-CM

## 2022-03-14 DIAGNOSIS — G4489 Other headache syndrome: Secondary | ICD-10-CM | POA: Diagnosis not present

## 2022-03-14 DIAGNOSIS — G35 Multiple sclerosis: Secondary | ICD-10-CM

## 2022-03-14 DIAGNOSIS — R202 Paresthesia of skin: Secondary | ICD-10-CM

## 2022-03-14 DIAGNOSIS — Z79899 Other long term (current) drug therapy: Secondary | ICD-10-CM

## 2022-03-14 MED ORDER — NORTRIPTYLINE HCL 10 MG PO CAPS
ORAL_CAPSULE | ORAL | 3 refills | Status: DC
Start: 2022-03-14 — End: 2023-04-07

## 2022-03-14 MED ORDER — SUMATRIPTAN SUCCINATE 100 MG PO TABS: 100.0000 mg | ORAL_TABLET | Freq: Once | ORAL | 11 refills | Status: AC | PRN

## 2022-03-14 MED ORDER — GABAPENTIN 100 MG PO CAPS: 100.0000 mg | ORAL_CAPSULE | Freq: Every day | ORAL | 3 refills | Status: DC | PRN

## 2022-03-14 MED ORDER — OXYBUTYNIN CHLORIDE ER 10 MG PO TB24: 10.0000 mg | ORAL_TABLET | Freq: Every day | ORAL | 3 refills | Status: DC

## 2022-03-14 NOTE — Progress Notes (Signed)
[facility]  PATIENT: Nancy Jennings DOB: Jul 18, 1981  REFERRING DOCTOR OR PCP:  [name], NP (PCP); Dr. [name] (Neurology) SOURCE: Patient, notes from Dr. [name], imaging and laboratory reports,  _________________________________   HISTORICAL  CHIEF COMPLAINT:  Chief Complaint  Patient presents with   Follow-up    Rm 1, alone. Here for 6 month MS f/u, on Tysabri and tolerating well. Pt c/o fo HA, come can last up to 3 days, takes Ibuprofen. 2-3x a month. Pt reports having a weird sensation when swallowing.     HISTORY OF PRESENT ILLNESS:  Nancy Jennings is a 41 year old woman with relapsing remitting multiple sclerosis.  Update 03/14/2022: She is on Tysabri.   JCV Ab is negative 0.19 (12/17/2020)   She tolerates it well and has not had any definite exacerbations.  Next infusion is 04/08/2022    Gait is doing well.   She could easily walk 3 miles.   She keeps up with others.  Balance is stable but she usually uses the bannister downstairs.    Strength is normal.    She reports dysaesthetitic pain in the arms, L>R. Marland Kitchen  Pain radiates into the hands, left > right.   The 2nd and 3rd fingers are most involved when pain enters the hand.   Gabapentin has helped the arm pain.   She takes 200 mg po tid.   She denies definite weakness in her arms and legs.      She has noted occasionally difficulty with swallow function but she has had no choking.   She does not cough after eating.   Her voice is fine.    She has urinary urgency with some urge incontinence.  She is having more headaches, almost always awakening her at night. She drinks a lot of caffeine but no changes over the last few months. They are located frontally bilaterally.   Advil helps (800 mg) sometimes   She does not have nausea, photophobia or phonophobia.   Frequency has reduced with nortriptyline 30 mg nightly and zonisamide.       She notes fatigue.   She has gained 40 pounds since  Covid pandemic.    She has done the vaccinations and is planning to do the booster.  She had Covid-19 in November 2020.      MS History: She was diagnosed with MS in late 2019.  In September 2019, she had the onset of left neck pain and then had numbness and pain that went down the left arm and then right arm and then right leg.  Symptoms evolved over 1-2 days.   Due to discomfort, she had trouble sleeping.   She saw her PCP a few days later.   She was referred her to [facility] and saw Dr. [name].   Her symptoms had already resolved over 2-3 weeks but she continued to have painful tingling in the left arm that persisted.   She saw Dr. [name] in October and had a NCV/EMG in [place].     Due to continued symptoms, she had a brain MRI.    It was consistent with MS and an MRI of the cervical spine was also ordered.   It showed a lesion to the left at C2 and a second lesion ventral midline at C5C6.   Her MRIs were done at [facility].  She was started on  Tysabri.   She is JCV Ab negative.    MRI 1/102/20:  Cervical  spinal cord: Ovoid, slightly expansile T2-hyperintense lesion in the left side of the spinal cord at the C2 vertebral level, measuring approximately 13 mm craniocaudal. Similar smaller, more subtle lesion in the ventral midline spinal cord at C6  MRI 10/16/2018 Brain: Multiple (greater than 10) T2/FLAIR-hyperintense white matter lesions in both cerebral hemispheres, both periventricular and juxtacortical, as well as, to a lesser extent, in both cerebellar hemispheres.  The brain volume is normal.   Incidental right frontal developmental venous anomaly.  MRI 10/18/2020 showed multiple T2/FLAIR hyperintense foci in the hemispheres and also a focus in the left cerebellar hemisphere and in the spinal cord.  These are consistent with chronic demyelinating plaque associated with multiple sclerosis.  None of the foci appears to be acute.  However, one focus in the left frontal  juxtacortical white matter was not present on the previous MRI and likely occurred during the 1 year interval.  MRI 12/26/2020 showed  multiple T2/FLAIR hyperintense foci in the cerebral hemispheres and left cerebellar hemisphere in a pattern and configuration consistent with chronic demyelinating plaque associated with multiple sclerosis.  None of the foci appear to be acute.  Compared to the MRI from 10/19/2019, there are no new lesions.  MRI cervical spine 12/26/2020  showed foci within the spinal cord to the left adjacent to C2 and centrally adjacent to C5-C6.  Possible very small focus posterolaterally to the left adjacent to C6.  No definite changes compared to the 10/30/2018 MRI.    REVIEW OF SYSTEMS: Constitutional: No fevers, chills, sweats, or change in appetite.  She notes fatigue. Eyes: No visual changes, double vision, eye pain Ear, nose and throat: No hearing loss, ear pain, nasal congestion, sore throat Cardiovascular: No chest pain, palpitations Respiratory:  No shortness of breath at rest or with exertion.   No wheezes GastrointestinaI: No nausea, vomiting, diarrhea, abdominal pain, fecal incontinence Genitourinary:  No dysuria, urinary retention or frequency.  No nocturia. Musculoskeletal:  No neck pain, back pain Integumentary: No rash, pruritus, skin lesions Neurological: as above Psychiatric: No depression at this time.  No anxiety Endocrine: No palpitations, diaphoresis, change in appetite, change in weigh or increased thirst Hematologic/Lymphatic:  No anemia, purpura, petechiae. Allergic/Immunologic: No itchy/runny eyes, nasal congestion, recent allergic reactions, rashes  ALLERGIES: No Known Allergies  HOME MEDICATIONS:  Current Outpatient Medications:    Cholecalciferol (VITAMIN D3) 50 MCG (2000 UT) capsule, Take 2,000 Units by mouth daily. , Disp: , Rfl:    dextromethorphan-guaiFENesin (MUCINEX DM) 30-600 MG 12hr tablet, Take 1 tablet by mouth 2 (two) times daily.,  Disp: , Rfl:    Ibuprofen (ADVIL PO), Take by mouth., Disp: , Rfl:    Loratadine 10 MG CAPS, Take by mouth., Disp: , Rfl:    meloxicam (MOBIC) 15 MG tablet, Take 1 tablet (15 mg total) by mouth daily., Disp: 90 tablet, Rfl: 3   Multiple Vitamin (MULTI-VITAMIN) tablet, Take 1 tablet by mouth daily., Disp: , Rfl:    natalizumab (TYSABRI) 300 MG/15ML injection, Inject 300 mg into the vein every 28 (twenty-eight) days., Disp: , Rfl:    oxybutynin (DITROPAN XL) 10 MG 24 hr tablet, Take 1 tablet (10 mg total) by mouth at bedtime., Disp: 90 tablet, Rfl: 3   SUMAtriptan (IMITREX) 100 MG tablet, Take 1 tablet (100 mg total) by mouth once as needed for up to 1 dose for migraine. May repeat in 2 hours if headache persists or recurs., Disp: 10 tablet, Rfl: 11   zonisamide (ZONEGRAN) 100 MG capsule, TAKE 1  CAPSULE(100 MG) BY MOUTH DAILY, Disp: 90 capsule, Rfl: 1   gabapentin (NEURONTIN) 100 MG capsule, Take 1-6 capsules (100-600 mg total) by mouth daily as needed., Disp: 540 capsule, Rfl: 3   nortriptyline (PAMELOR) 10 MG capsule, TAKE 2 TO 3 CAPSULES BY MOUTH EVERY NIGHT AT BEDTIME, Disp: 270 capsule, Rfl: 3  PAST MEDICAL HISTORY: Past Medical History:  Diagnosis Date   Multiple sclerosis (HCC)    Neuromuscular disorder (HCC)     PAST SURGICAL HISTORY: Past Surgical History:  Procedure Laterality Date   DILATION AND CURETTAGE OF UTERUS     FRACTURE SURGERY  02/2018   left ankle    FAMILY HISTORY: History reviewed. No pertinent family history.  SOCIAL HISTORY:  Social History   Socioeconomic History   Marital status: Married    Spouse name: Thereasa Distance   Number of children: Not on file   Years of education: Not on file   Highest education level: Not on file  Occupational History   Occupation: [employer]   Tobacco Use   Smoking status: Never   Smokeless tobacco: Never  Substance and Sexual Activity   Alcohol use: Yes    Alcohol/week: 1.0 standard drink    Types: 1 Shots of liquor per week     Comment: rarely - about 1x a month   Drug use: Never   Sexual activity: Not on file  Other Topics Concern   Not on file  Social History Narrative   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   Right handed    Lives with husband   Social Determinants of Health   Financial Resource Strain: Not on file  Food Insecurity: Not on file  Transportation Needs: Not on file  Physical Activity: Not on file  Stress: Not on file  Social Connections: Not on file  Intimate Partner Violence: Not on file     PHYSICAL EXAM  Vitals:   03/14/22 0947  BP: (!) 150/95  Pulse: 89  Weight: (!) 322 lb 8 oz (146.3 kg)  Height: 5\' 8"  (1.727 m)    Body mass index is 49.04 kg/m.   General: The patient is well-developed and well-nourished and in no acute distress.  Neck has good range of motion.  HEENT:  Head is [place]/AT.  Sclera are anicteric.      Skin: Extremities are without rash or  edema.  Neurologic Exam  Mental status: The patient is alert and oriented x 3 at the time of the examination. The patient has apparent normal recent and remote memory, with an apparently normal attention span and concentration ability.   Speech is normal.  Cranial nerves: Extraocular movements are full.  .  Facial symmetry is present. There is good facial sensation to soft touch bilaterally.Facial strength is normal.  Trapezius and sternocleidomastoid strength is normal. No dysarthria is noted.  No obvious hearing deficits are noted.  Motor:  Muscle bulk is normal.   Tone is normal. Strength is  5 / 5 in all 4 extremities.   Sensory: On sensory exam, she has mildly reduced sensation to temperature and touch in the right arm and leg relative to the left but symmetric sensation to vibration  Coordination: Cerebellar testing reveals good finger-nose-finger and heel-to-shin bilaterally.  Gait and station: Station is normal.   Gait is normal.Tandem gait is mildly wide. [place]berg is negative.   Reflexes: Deep tendon reflexes are  symmetric and normal bilaterally.      DIAGNOSTIC DATA (LABS, IMAGING, TESTING) - I reviewed patient records, labs,  notes, testing and imaging myself where available.  Lab Results  Component Value Date   WBC 9.4 12/17/2021   HGB 12.4 12/17/2021   HCT 40.0 12/17/2021   MCV 80 12/17/2021   PLT 244 12/17/2021       ASSESSMENT AND PLAN  1. Relapsing remitting multiple sclerosis (HCC)   2. High risk medication use   3. Numbness and tingling in left arm   4. Other headache syndrome   5. Urge incontinence      1.   Continue Tysabri.  To determine if there is breakthrough activity, check MRI brain and cervical spine.  Consider a different DMT if this is occurring.   2.   Continue  gabapentin 200 mg po tid  3.   For migraines:    Zonisamide, nortriptyline.  Add sumatriptan prn  If frequency increases, consider an anti-CGRP agent 4.   Oxybutynin XL 10 mg for urinary urgency 5.   If swallowing worsens, consider speech/MBS. 6.   RTC 6 months or sooner if new or worsening   Nancy Jennings A. Epimenio Foot, MD, PhD, FAAN Certified in Neurology, Clinical Neurophysiology, Sleep Medicine, Pain Medicine and Neuroimaging Director, [facility]  [facility] [address], [address] [place], [place] [address] [phone]  Addendum:

## 2022-06-22 ENCOUNTER — Encounter: Payer: Self-pay | Admitting: Hematology and Oncology

## 2022-06-22 ENCOUNTER — Other Ambulatory Visit: Payer: Self-pay

## 2022-06-22 ENCOUNTER — Emergency Department
Admission: EM | Admit: 2022-06-22 | Discharge: 2022-06-22 | Disposition: A | Discharge status: Home / Self Care | Payer: 59 | Attending: Emergency Medicine | Admitting: Emergency Medicine

## 2022-06-22 ENCOUNTER — Encounter: Payer: Self-pay | Admitting: Emergency Medicine

## 2022-06-22 DIAGNOSIS — S199XXA Unspecified injury of neck, initial encounter: Secondary | ICD-10-CM | POA: Insufficient documentation

## 2022-06-22 DIAGNOSIS — R209 Unspecified disturbances of skin sensation: Secondary | ICD-10-CM | POA: Diagnosis not present

## 2022-06-22 DIAGNOSIS — X58XXXA Exposure to other specified factors, initial encounter: Secondary | ICD-10-CM | POA: Diagnosis not present

## 2022-06-22 DIAGNOSIS — M542 Cervicalgia: Secondary | ICD-10-CM

## 2022-06-22 MED ORDER — CYCLOBENZAPRINE HCL 10 MG PO TABS
10.0000 mg | ORAL_TABLET | Freq: Once | ORAL | Status: AC
  Administered 2022-06-22: 10 mg via ORAL
  Filled 2022-06-22: qty 1

## 2022-06-22 MED ORDER — CYCLOBENZAPRINE HCL 5 MG PO TABS: 5.0000 mg | ORAL_TABLET | Freq: Three times a day (TID) | ORAL | 0 refills | Status: DC | PRN

## 2022-06-22 NOTE — Discharge Instructions (Addendum)
Your exam is reassuring without any signs of any acute neuromuscular deficits. Take the muscle relaxant as directed. Follow-up with your neurologist as discussed.

## 2022-06-22 NOTE — ED Triage Notes (Signed)
Pt reports woke up Monday with pain in her neck. Pt states since then the pain has gotten worse daily and it hurts worse to look left, right, up or down. Denies injuries

## 2022-06-22 NOTE — ED Provider Notes (Signed)
[facility] Emergency Department Provider Note     Event Date/Time   First MD Initiated Contact with Patient 06/22/22 1324     (approximate)   History   Neck Injury   HPI  Nancy Jennings is a 41 y.o. female with a history of relapsing remitting MS, presents to the ED for evaluation of neck pain when she woke up Monday.  She reports worsening pain since that time that seem to increase with attempts to range the neck and normal range of motion but denies any distal paresthesias, potential cause of her weakness.  Denies any recent injury, trauma, falls.  Reports some improvement of her symptoms after taking one of her husbands muscle relaxants.   She was diagnosed with MS in late 2019.  In September 2019, she had the onset of left neck pain and then had numbness and pain that went down the left arm and then right arm and then right leg.  Symptoms evolved over 1-2 days. She was referred her to [facility] and saw Dr. [name].   Her symptoms had already resolved over 2-3 weeks but she continued to have painful tingling in the left arm that persisted.   She saw Dr. [name] in October and had a NCV/EMG in November.     Due to continued symptoms, she had a brain MRI.    It was consistent with MS and an MRI of the cervical spine was also ordered.   It showed a lesion to the left at C2 and a second lesion ventral midline at C5C6.   Her MRIs were done at [facility].  She was started on  Tysabri.   She is JCV Ab negative.    Physical Exam   Triage Vital Signs: ED Triage Vitals  Enc Vitals Group     BP 06/22/22 1159 (!) 168/92     Pulse Rate 06/22/22 1159 (!) 110     Resp 06/22/22 1159 20     Temp 06/22/22 1159 98.3 F (36.8 C)     Temp Source 06/22/22 1159 Oral     SpO2 --      Weight 06/22/22 1158 (!) 308 lb 10.3 oz (140 kg)     Height 06/22/22 1158 5\' 8"  (1.727 m)     Head Circumference --      Peak Flow --      Pain Score 06/22/22 1158 7      Pain Loc --      Pain Edu? --      Excl. in GC? --     Most recent vital signs: Vitals:   06/22/22 1442 06/22/22 1606  BP: (!) 139/97 (!) 127/90  Pulse: 76 80  Resp: 16 18  Temp: 97.9 F (36.6 C) 97.7 F (36.5 C)  SpO2: 100% 98%    General Awake, no distress. NAD HEENT NCAT. PERRL. EOMI. No rhinorrhea. Mucous membranes are moist. CV:  Good peripheral perfusion. RRR RESP:  Normal effort. CTA ABD:  No distention.  MSK:  Spinal alignment without midline tenderness, spasm, homely, or step-off. NEURO: Cranial nerves II through XII grossly intact.  Normal UE DTRs bilaterally.  Normal interest in outpatient testing noted.  ED Results / Procedures / Treatments   Labs (all labs ordered are listed, but only abnormal results are displayed) Labs Reviewed - No data to display   EKG    RADIOLOGY   No results found.   PROCEDURES:  Critical Care performed: No  Procedures  MEDICATIONS ORDERED IN ED: Medications  cyclobenzaprine (FLEXERIL) tablet 10 mg (10 mg Oral Given 06/22/22 1443)     IMPRESSION / MDM / ASSESSMENT AND PLAN / ED COURSE  I reviewed the triage vital signs and the nursing notes.                              Differential diagnosis includes, but is not limited to, myalgias, MS relapse, cervical strain, cervical radiculopathy  Patient's presentation is most consistent with acute, uncomplicated illness.  Patient with a recent diagnosis of R/RM who presents to the ED for evaluation of some intermittent pain in her neck.  She denies any recent injury, trauma, or fall.  Also denies any significant neurodeficits.  She reports mechanical pain is reproducible with active range of motion and palpation along the neck.  No red flags on exam.  No signs of any acute neurodeficit, no concern for meningitis.  Patient's diagnosis is consistent with cervical myalgias without evidence of radiculopathy or neuro muscle deficit concerning for MS relapse.  Endorsing  headache and improvement of her symptoms after muscle accident menstruation in the ED.  Patient will be discharged home with prescriptions for Zofran. Patient is to follow up with neurology as needed or otherwise directed. Patient is given ED precautions to return to the ED for any worsening or new symptoms.     FINAL CLINICAL IMPRESSION(S) / ED DIAGNOSES   Final diagnoses:  Neck pain on right side     Rx / DC Orders   ED Discharge Orders          Ordered    cyclobenzaprine (FLEXERIL) 5 MG tablet  3 times daily PRN        06/22/22 1421             Note:  This document was prepared using Dragon voice recognition software and may include unintentional dictation errors.    Lissa Hoard, PA-C 06/24/22 1933    Pilar Jarvis, MD 06/25/22 [phone]

## 2022-07-01 ENCOUNTER — Other Ambulatory Visit: Payer: Self-pay | Admitting: Neurology

## 2022-07-01 ENCOUNTER — Encounter: Payer: Self-pay | Admitting: Hematology and Oncology

## 2022-07-01 ENCOUNTER — Other Ambulatory Visit (INDEPENDENT_AMBULATORY_CARE_PROVIDER_SITE_OTHER): Payer: Self-pay

## 2022-07-01 ENCOUNTER — Telehealth: Payer: Self-pay | Admitting: Neurology

## 2022-07-01 DIAGNOSIS — M79602 Pain in left arm: Secondary | ICD-10-CM

## 2022-07-01 DIAGNOSIS — Z79899 Other long term (current) drug therapy: Secondary | ICD-10-CM

## 2022-07-01 DIAGNOSIS — Z0289 Encounter for other administrative examinations: Secondary | ICD-10-CM

## 2022-07-01 DIAGNOSIS — R200 Anesthesia of skin: Secondary | ICD-10-CM

## 2022-07-01 DIAGNOSIS — G35 Multiple sclerosis: Secondary | ICD-10-CM

## 2022-07-01 NOTE — Telephone Encounter (Signed)
Placed JCV lab in [facility] lock box for routine lab pick up. Results pending. 

## 2022-07-02 LAB — CBC WITH DIFFERENTIAL/PLATELET
Basophils Absolute: 0 10*3/uL (ref 0.0–0.2)
Basos: 1 %
EOS (ABSOLUTE): 0.2 10*3/uL (ref 0.0–0.4)
Eos: 5 %
Hematocrit: 39.6 % (ref 34.0–46.6)
Hemoglobin: 12.5 g/dL (ref 11.1–15.9)
Immature Grans (Abs): 0 10*3/uL (ref 0.0–0.1)
Immature Granulocytes: 0 %
Lymphocytes Absolute: 2.1 10*3/uL (ref 0.7–3.1)
Lymphs: 43 %
MCH: 25.2 pg — ABNORMAL LOW (ref 26.6–33.0)
MCHC: 31.6 g/dL (ref 31.5–35.7)
MCV: 80 fL (ref 79–97)
Monocytes Absolute: 0.3 10*3/uL (ref 0.1–0.9)
Monocytes: 6 %
NRBC: 1 % — ABNORMAL HIGH (ref 0–0)
Neutrophils Absolute: 2.3 10*3/uL (ref 1.4–7.0)
Neutrophils: 45 %
Platelets: 231 10*3/uL (ref 150–450)
RBC: 4.96 x10E6/uL (ref 3.77–5.28)
RDW: 14.6 % (ref 11.7–15.4)
WBC: 5 10*3/uL (ref 3.4–10.8)

## 2022-07-08 NOTE — Telephone Encounter (Signed)
Collected 07/01/2022.  JCV index value: 0.26 (High) JCV antibody: indeterminate

## 2022-09-02 ENCOUNTER — Other Ambulatory Visit: Payer: Self-pay | Admitting: Neurology

## 2022-09-10 ENCOUNTER — Encounter: Payer: Self-pay | Admitting: Neurology

## 2022-09-18 ENCOUNTER — Encounter: Payer: Self-pay | Admitting: Hematology and Oncology

## 2022-09-19 ENCOUNTER — Encounter: Payer: Self-pay | Admitting: Hematology and Oncology

## 2022-09-23 ENCOUNTER — Encounter: Payer: Self-pay | Admitting: Hematology and Oncology

## 2022-09-23 ENCOUNTER — Ambulatory Visit: Payer: 59 | Admitting: Neurology

## 2022-09-23 ENCOUNTER — Encounter: Payer: Self-pay | Admitting: Neurology

## 2022-09-23 VITALS — BP 179/113 | Ht 68.0 in | Wt 332.0 lb

## 2022-09-23 DIAGNOSIS — G4489 Other headache syndrome: Secondary | ICD-10-CM

## 2022-09-23 DIAGNOSIS — G35 Multiple sclerosis: Secondary | ICD-10-CM

## 2022-09-23 DIAGNOSIS — Z79899 Other long term (current) drug therapy: Secondary | ICD-10-CM

## 2022-09-23 DIAGNOSIS — R202 Paresthesia of skin: Secondary | ICD-10-CM

## 2022-09-23 DIAGNOSIS — R200 Anesthesia of skin: Secondary | ICD-10-CM

## 2022-09-23 DIAGNOSIS — N3941 Urge incontinence: Secondary | ICD-10-CM

## 2022-09-23 NOTE — Progress Notes (Signed)
[facility]  PATIENT: Nancy Jennings DOB: 1980/11/27  REFERRING DOCTOR OR PCP:  [name], NP (PCP); Dr. [name] (Neurology) SOURCE: Patient, notes from Dr. [name], imaging and laboratory reports,  _________________________________   HISTORICAL  CHIEF COMPLAINT:  Chief Complaint  Patient presents with   Follow-up    Pt in room #10 and alone. Pt here today for f/u on tysabri for MS.    HISTORY OF PRESENT ILLNESS:  Nancy Jennings is a 41 year old woman with relapsing remitting multiple sclerosis.  Update12/01/2022: She is on Tysabri.   JCV Ab is negative 0.26 (07/01/2022   She tolerates it well and has not had any definite exacerbations.  MRIs in 12/24/2020 of brain nad cervical spine showed no new lesions.    Gait is doing well.   She could easily walk 3 miles.   She keeps up with others.  Balance is stable but she usually uses the bannister downstairs.    Strength is normal.    She reports dysaesthetitic pain in the arms, L>R. Marland Kitchen  Pain radiates into the hands, left > right.   The 2nd and 3rd fingers are most involved when pain enters the hand.   Gabapentin has helped the arm pain.   She takes 200 mg po tid.   She denies definite weakness in her arms and legs.      Swallow function is better since taking OTC Prilosec (she also had GERD).   Her voice is fine.    She has urinary urgency with some urge incontinence much better on oxybutynin XL.    She has had only 3 bad migraines since the last visit.     She has occasional milder ones and will take an NSAID.   She is on nortriptyline and zonisamide for prophylaxis.   She notes fatigue.   She has gained 40 pounds since Covid pandemic.    She has less stress quitting her old job and working more from home.  MS History: She was diagnosed with MS in late 2019.  In September 2019, she had the onset of left neck pain and then had numbness and pain that went down the left arm and then right arm and  then right leg.  Symptoms evolved over 1-2 days.   Due to discomfort, she had trouble sleeping.   She saw her PCP a few days later.   She was referred her to [facility] and saw Dr. [name].   Her symptoms had already resolved over 2-3 weeks but she continued to have painful tingling in the left arm that persisted.   She saw Dr. [name] in October and had a NCV/EMG in [place].     Due to continued symptoms, she had a brain MRI.    It was consistent with MS and an MRI of the cervical spine was also ordered.   It showed a lesion to the left at C2 and a second lesion ventral midline at C5C6.   Her MRIs were done at [facility].  She was started on  Tysabri.   She is JCV Ab negative.    MRI 1/102/20:  Cervical spinal cord: Ovoid, slightly expansile T2-hyperintense lesion in the left side of the spinal cord at the C2 vertebral level, measuring approximately 13 mm craniocaudal. Similar smaller, more subtle lesion in the ventral midline spinal cord at C6  MRI 10/16/2018 Brain: Multiple (greater than 10) T2/FLAIR-hyperintense white matter lesions in both cerebral hemispheres, both periventricular and juxtacortical,  as well as, to a lesser extent, in both cerebellar hemispheres.  The brain volume is normal.   Incidental right frontal developmental venous anomaly.  MRI 10/18/2020 showed multiple T2/FLAIR hyperintense foci in the hemispheres and also a focus in the left cerebellar hemisphere and in the spinal cord.  These are consistent with chronic demyelinating plaque associated with multiple sclerosis.  None of the foci appears to be acute.  However, one focus in the left frontal juxtacortical white matter was not present on the previous MRI and likely occurred during the 1 year interval.  MRI 12/26/2020 showed  multiple T2/FLAIR hyperintense foci in the cerebral hemispheres and left cerebellar hemisphere in a pattern and configuration consistent with chronic demyelinating plaque associated with  multiple sclerosis.  None of the foci appear to be acute.  Compared to the MRI from 10/19/2019, there are no new lesions.  MRI cervical spine 12/26/2020  showed foci within the spinal cord to the left adjacent to C2 and centrally adjacent to C5-C6.  Possible very small focus posterolaterally to the left adjacent to C6.  No definite changes compared to the 10/30/2018 MRI.    REVIEW OF SYSTEMS: Constitutional: No fevers, chills, sweats, or change in appetite.  She notes fatigue. Eyes: No visual changes, double vision, eye pain Ear, nose and throat: No hearing loss, ear pain, nasal congestion, sore throat Cardiovascular: No chest pain, palpitations Respiratory:  No shortness of breath at rest or with exertion.   No wheezes GastrointestinaI: No nausea, vomiting, diarrhea, abdominal pain, fecal incontinence Genitourinary:  No dysuria, urinary retention or frequency.  No nocturia. Musculoskeletal:  No neck pain, back pain Integumentary: No rash, pruritus, skin lesions Neurological: as above Psychiatric: No depression at this time.  No anxiety Endocrine: No palpitations, diaphoresis, change in appetite, change in weigh or increased thirst Hematologic/Lymphatic:  No anemia, purpura, petechiae. Allergic/Immunologic: No itchy/runny eyes, nasal congestion, recent allergic reactions, rashes  ALLERGIES: No Known Allergies  HOME MEDICATIONS:  Current Outpatient Medications:    Cholecalciferol (VITAMIN D3) 50 MCG (2000 UT) capsule, Take 2,000 Units by mouth daily. , Disp: , Rfl:    cyclobenzaprine (FLEXERIL) 5 MG tablet, Take 1-2 tablets (5-10 mg total) by mouth 3 (three) times daily as needed., Disp: 30 tablet, Rfl: 0   gabapentin (NEURONTIN) 100 MG capsule, Take 1-6 capsules (100-600 mg total) by mouth daily as needed., Disp: 540 capsule, Rfl: 3   Ibuprofen (ADVIL PO), Take by mouth., Disp: , Rfl:    Loratadine 10 MG CAPS, Take by mouth., Disp: , Rfl:    Multiple Vitamin (MULTI-VITAMIN) tablet, Take  1 tablet by mouth daily., Disp: , Rfl:    natalizumab (TYSABRI) 300 MG/15ML injection, Inject 300 mg into the vein every 28 (twenty-eight) days., Disp: , Rfl:    nortriptyline (PAMELOR) 10 MG capsule, TAKE 2 TO 3 CAPSULES BY MOUTH EVERY NIGHT AT BEDTIME, Disp: 270 capsule, Rfl: 3   oxybutynin (DITROPAN XL) 10 MG 24 hr tablet, Take 1 tablet (10 mg total) by mouth at bedtime., Disp: 90 tablet, Rfl: 3   SUMAtriptan (IMITREX) 100 MG tablet, Take 1 tablet (100 mg total) by mouth once as needed for up to 1 dose for migraine. May repeat in 2 hours if headache persists or recurs., Disp: 10 tablet, Rfl: 11   zonisamide (ZONEGRAN) 100 MG capsule, TAKE 1 CAPSULE(100 MG) BY MOUTH DAILY, Disp: 90 capsule, Rfl: 1   dextromethorphan-guaiFENesin (MUCINEX DM) 30-600 MG 12hr tablet, Take 1 tablet by mouth 2 (two) times daily. (Patient  not taking: Reported on 09/23/2022), Disp: , Rfl:    meloxicam (MOBIC) 15 MG tablet, Take 1 tablet (15 mg total) by mouth daily. (Patient not taking: Reported on 09/23/2022), Disp: 90 tablet, Rfl: 3  PAST MEDICAL HISTORY: Past Medical History:  Diagnosis Date   Multiple sclerosis (HCC)    Neuromuscular disorder (HCC)     PAST SURGICAL HISTORY: Past Surgical History:  Procedure Laterality Date   DILATION AND CURETTAGE OF UTERUS     FRACTURE SURGERY  02/2018   left ankle    FAMILY HISTORY: No family history on file.  SOCIAL HISTORY:  Social History   Socioeconomic History   Marital status: Married    Spouse name: Thereasa Distance   Number of children: Not on file   Years of education: Not on file   Highest education level: Not on file  Occupational History   Occupation: [employer]   Tobacco Use   Smoking status: Never   Smokeless tobacco: Never  Substance and Sexual Activity   Alcohol use: Yes    Alcohol/week: 1.0 standard drink of alcohol    Types: 1 Shots of liquor per week    Comment: rarely - about 1x a month   Drug use: Never   Sexual activity: Not on file  Other  Topics Concern   Not on file  Social History Narrative   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   Right handed    Lives with husband   Social Determinants of Health   Financial Resource Strain: Not on file  Food Insecurity: Not on file  Transportation Needs: Not on file  Physical Activity: Not on file  Stress: Not on file  Social Connections: Not on file  Intimate Partner Violence: Not on file     PHYSICAL EXAM  Vitals:   09/23/22 1100  BP: (!) 179/113  Weight: (!) 332 lb (150.6 kg)  Height: 5\' 8"  (1.727 m)   REPEAT 155/95  Body mass index is 50.48 kg/m.   General: The patient is well-developed and well-nourished and in no acute distress.  Neck has good range of motion.  HEENT:  Head is [place]/AT.  Sclera are anicteric.      Skin: Extremities are without rash or  edema.  Neurologic Exam  Mental status: The patient is alert and oriented x 3 at the time of the examination. The patient has apparent normal recent and remote memory, with an apparently normal attention span and concentration ability.   Speech is normal.  Cranial nerves: Extraocular movements are full.  .  Facial symmetry is present. There is good facial sensation to soft touch bilaterally.Facial strength is normal.  Trapezius and sternocleidomastoid strength is normal. No dysarthria is noted.  No obvious hearing deficits are noted.  Motor:  Muscle bulk is normal.   Tone is normal. Strength is  5 / 5 in all 4 extremities.   Sensory: On sensory exam, she has mildly reduced sensation to temperature and touch in the right arm and leg relative to the left but symmetric sensation to vibration  Coordination: Cerebellar testing reveals good finger-nose-finger and heel-to-shin bilaterally.  Gait and station: Station is normal.  Gait is normal.  Tandem gait is mildly wide.   Romberg is negative.   Reflexes: Deep tendon reflexes are symmetric and normal bilaterally.      DIAGNOSTIC DATA (LABS, IMAGING, TESTING) - I reviewed  patient records, labs, notes, testing and imaging myself where available.  Lab Results  Component Value Date   WBC 5.0 07/01/2022  HGB 12.5 07/01/2022   HCT 39.6 07/01/2022   MCV 80 07/01/2022   PLT 231 07/01/2022       ASSESSMENT AND PLAN  1. Relapsing remitting multiple sclerosis (HCC)   2. High risk medication use   3. Numbness and tingling in left arm   4. Other headache syndrome   5. Urge incontinence     1.   Continue Tysabri.  Sometime, check MRI brain and cervical spine (ok to go 2 years in between).  Consider a different DMT if this is occurring.   2.   Continue  gabapentin 200 mg po tid  3.   For migraines:    Currently on Zonisamide, nortriptyline.  Continue  sumatriptan prn  We discussed stopping zonisamide to see if migraines continue to be rare.   4.   Oxybutynin XL 10 mg for urinary urgency 5.   BP was elevated and I recommend she see PCP.  Also discussed weight loss 6.   RTC 6 months or sooner if new or worsening    Sophiya Morello A. Epimenio Foot, MD, PhD, FAAN Certified in Neurology, Clinical Neurophysiology, Sleep Medicine, Pain Medicine and Neuroimaging Director, [facility] at [facility]  [facility] [address] [place], [place] [address] [phone]  Addendum:

## 2022-09-23 NOTE — Patient Instructions (Signed)
BP (repeat checked by physician) was 155/95 Advised to discuss further with PCP ALso advised to try to lose eright.

## 2022-10-01 ENCOUNTER — Ambulatory Visit: Payer: 59 | Admitting: Neurology

## 2022-10-02 ENCOUNTER — Encounter: Payer: Self-pay | Admitting: Hematology and Oncology

## 2022-10-08 ENCOUNTER — Encounter: Payer: Self-pay | Admitting: Neurology

## 2022-10-24 ENCOUNTER — Encounter: Payer: Self-pay | Admitting: Hematology and Oncology

## 2022-10-31 ENCOUNTER — Encounter: Payer: Self-pay | Admitting: Hematology and Oncology

## 2022-11-11 ENCOUNTER — Ambulatory Visit: Payer: 59 | Admitting: Neurology

## 2023-01-02 ENCOUNTER — Other Ambulatory Visit: Payer: Self-pay | Admitting: *Deleted

## 2023-01-02 ENCOUNTER — Telehealth: Payer: Self-pay | Admitting: *Deleted

## 2023-01-02 ENCOUNTER — Other Ambulatory Visit: Payer: Self-pay

## 2023-01-02 DIAGNOSIS — G35 Multiple sclerosis: Secondary | ICD-10-CM

## 2023-01-02 DIAGNOSIS — Z79899 Other long term (current) drug therapy: Secondary | ICD-10-CM

## 2023-01-02 NOTE — Telephone Encounter (Signed)
Placed JCV lab in [facility] lock box for routine lab pick up. Results pending. 

## 2023-01-03 LAB — CBC WITH DIFFERENTIAL/PLATELET
Basophils Absolute: 0.1 10*3/uL (ref 0.0–0.2)
Basos: 1 %
EOS (ABSOLUTE): 0.4 10*3/uL (ref 0.0–0.4)
Eos: 7 %
Hematocrit: 42.7 % (ref 34.0–46.6)
Hemoglobin: 13.4 g/dL (ref 11.1–15.9)
Immature Grans (Abs): 0 10*3/uL (ref 0.0–0.1)
Immature Granulocytes: 1 %
Lymphocytes Absolute: 2.2 10*3/uL (ref 0.7–3.1)
Lymphs: 37 %
MCH: 25.2 pg — ABNORMAL LOW (ref 26.6–33.0)
MCHC: 31.4 g/dL — ABNORMAL LOW (ref 31.5–35.7)
MCV: 80 fL (ref 79–97)
Monocytes Absolute: 0.4 10*3/uL (ref 0.1–0.9)
Monocytes: 6 %
NRBC: 1 % — ABNORMAL HIGH (ref 0–0)
Neutrophils Absolute: 2.9 10*3/uL (ref 1.4–7.0)
Neutrophils: 48 %
Platelets: 229 10*3/uL (ref 150–450)
RBC: 5.31 x10E6/uL — ABNORMAL HIGH (ref 3.77–5.28)
RDW: 15.3 % (ref 11.7–15.4)
WBC: 6 10*3/uL (ref 3.4–10.8)

## 2023-01-13 NOTE — Telephone Encounter (Signed)
JCV ab drawn on 01/02/23 indeterminate, index: 0.22. Inhibition assay: negative.

## 2023-01-27 ENCOUNTER — Telehealth: Payer: Self-pay | Admitting: Neurology

## 2023-01-27 NOTE — Telephone Encounter (Signed)
[employer] Financial Assistance team is asking for a complimentary does of Tysabri for pt the # to call should additional info be needed on this request is [phone]

## 2023-01-27 NOTE — Telephone Encounter (Signed)
Spoke w/ Kim/[facility]. States pt in process of getting new insurance. She will call back to set up complimentary dose.

## 2023-01-30 DIAGNOSIS — G35 Multiple sclerosis: Secondary | ICD-10-CM | POA: Diagnosis not present

## 2023-02-27 DIAGNOSIS — G35 Multiple sclerosis: Secondary | ICD-10-CM | POA: Diagnosis not present

## 2023-03-04 ENCOUNTER — Other Ambulatory Visit: Payer: Self-pay | Admitting: Physician Assistant

## 2023-03-04 ENCOUNTER — Encounter: Payer: Self-pay | Admitting: Hematology and Oncology

## 2023-03-04 DIAGNOSIS — Z1231 Encounter for screening mammogram for malignant neoplasm of breast: Secondary | ICD-10-CM

## 2023-03-06 ENCOUNTER — Ambulatory Visit
Admission: RE | Admit: 2023-03-06 | Discharge: 2023-03-06 | Disposition: A | Discharge status: Home / Self Care | Payer: 59 | Source: Ambulatory Visit | Attending: Physician Assistant | Admitting: Physician Assistant

## 2023-03-06 ENCOUNTER — Encounter: Payer: Self-pay | Admitting: Hematology and Oncology

## 2023-03-06 DIAGNOSIS — Z1231 Encounter for screening mammogram for malignant neoplasm of breast: Secondary | ICD-10-CM | POA: Diagnosis not present

## 2023-03-12 ENCOUNTER — Other Ambulatory Visit: Payer: Self-pay | Admitting: Neurology

## 2023-03-12 NOTE — Telephone Encounter (Signed)
Pt last seen on 09/23/22 per note "Oxybutynin XL 10 mg for urinary urgency "  Follow up scheduled on 03/31/23

## 2023-03-23 ENCOUNTER — Other Ambulatory Visit: Payer: Self-pay | Admitting: Neurology

## 2023-03-27 DIAGNOSIS — G35 Multiple sclerosis: Secondary | ICD-10-CM | POA: Diagnosis not present

## 2023-03-31 ENCOUNTER — Ambulatory Visit: Payer: 59 | Admitting: Neurology

## 2023-03-31 ENCOUNTER — Encounter: Payer: Self-pay | Admitting: Neurology

## 2023-03-31 ENCOUNTER — Encounter: Payer: Self-pay | Admitting: Hematology and Oncology

## 2023-03-31 VITALS — BP 124/81 | HR 83 | Ht 68.0 in | Wt 310.0 lb

## 2023-03-31 DIAGNOSIS — G35 Multiple sclerosis: Secondary | ICD-10-CM

## 2023-03-31 DIAGNOSIS — Z79899 Other long term (current) drug therapy: Secondary | ICD-10-CM

## 2023-03-31 DIAGNOSIS — N3941 Urge incontinence: Secondary | ICD-10-CM | POA: Diagnosis not present

## 2023-03-31 DIAGNOSIS — R200 Anesthesia of skin: Secondary | ICD-10-CM

## 2023-03-31 DIAGNOSIS — R202 Paresthesia of skin: Secondary | ICD-10-CM

## 2023-03-31 MED ORDER — SOLIFENACIN SUCCINATE 5 MG PO TABS: 5.0000 mg | ORAL_TABLET | Freq: Every day | ORAL | 3 refills | Status: DC

## 2023-03-31 MED ORDER — GABAPENTIN 100 MG PO CAPS: 100.0000 mg | ORAL_CAPSULE | Freq: Every day | ORAL | 3 refills | Status: DC | PRN

## 2023-03-31 NOTE — Progress Notes (Signed)
[facility]  PATIENT: Nancy Jennings DOB: [birth year]  REFERRING DOCTOR OR PCP:  [name], NP (PCP); Dr. [name] (Neurology) SOURCE: Patient, notes from Dr. [name], imaging and laboratory reports,  _________________________________   HISTORICAL  CHIEF COMPLAINT:  Chief Complaint  Patient presents with   Follow-up    RM 11, alone. Last seen 07/24/22. MS DMT: Tysabri. Stopped oixybutynin d/t dry mouth about 2 months ago. Dry mouth has resolved.    HISTORY OF PRESENT ILLNESS:  Nancy Jennings is a 42 year old woman with relapsing remitting multiple sclerosis.  Update12/01/2022: She is on Tysabri.   JCV Ab is negative 0.22 (12/2022)   She tolerates it well and has not had any definite exacerbations.  MRIs in 12/24/2020 of brain nad cervical spine showed no new lesions.    She feels neurologically stable.   Gait is doing well.   She could easily walk 3 miles.   She keeps up with others.  Balance is stable but she usually uses the bannister downstairs.    Strength is normal.    She reports dysaesthetitic pain in the arms, L>R and left leg. .    Gabapentin has helped the arm pain.   She is now taking it  200 mg po tid.   She denies definite weakness in her arms and legs.      Dysphagia resolved with OTC Prilosec (she also had GERD).   Her voice is fine.     She has urinary urgency with rare urge incontinence much better on oxybutynin XL.  However, it caused a bad dry mouth and she stopped.    She has had only 3 bad migraines since the last visit.     She has occasional milder ones and will take an NSAID.   She is on nortriptyline and zonisamide for prophylaxis.    She tried stopping zonisamide but HA returned so she restarted it.  She notes fatigue.      MS History: She was diagnosed with MS in late 2019.  In September 2019, she had the onset of left neck pain and then had numbness and pain that went down the left arm and then right arm and then  right leg.  Symptoms evolved over 1-2 days.   Due to discomfort, she had trouble sleeping.   She saw her PCP a few days later.   She was referred her to [facility] and saw Dr. [name].   Her symptoms had already resolved over 2-3 weeks but she continued to have painful tingling in the left arm that persisted.   She saw Dr. [name] in October and had a NCV/EMG in [place].     Due to continued symptoms, she had a brain MRI.    It was consistent with MS and an MRI of the cervical spine was also ordered.   It showed a lesion to the left at C2 and a second lesion ventral midline at C5C6.   Her MRIs were done at [facility].  She was started on  Tysabri.   She is JCV Ab negative.    MRI 1/102/20:  Cervical spinal cord: Ovoid, slightly expansile T2-hyperintense lesion in the left side of the spinal cord at the C2 vertebral level, measuring approximately 13 mm craniocaudal. Similar smaller, more subtle lesion in the ventral midline spinal cord at C6  MRI 10/16/2018 Brain: Multiple (greater than 10) T2/FLAIR-hyperintense white matter lesions in both cerebral hemispheres, both periventricular and juxtacortical, as well as,  to a lesser extent, in both cerebellar hemispheres.  The brain volume is normal.   Incidental right frontal developmental venous anomaly.  MRI 10/18/2020 showed multiple T2/FLAIR hyperintense foci in the hemispheres and also a focus in the left cerebellar hemisphere and in the spinal cord.  These are consistent with chronic demyelinating plaque associated with multiple sclerosis.  None of the foci appears to be acute.  However, one focus in the left frontal juxtacortical white matter was not present on the previous MRI and likely occurred during the 1 year interval.  MRI 12/26/2020 showed  multiple T2/FLAIR hyperintense foci in the cerebral hemispheres and left cerebellar hemisphere in a pattern and configuration consistent with chronic demyelinating plaque associated with multiple  sclerosis.  None of the foci appear to be acute.  Compared to the MRI from 10/19/2019, there are no new lesions.  MRI cervical spine 12/26/2020  showed foci within the spinal cord to the left adjacent to C2 and centrally adjacent to C5-C6.  Possible very small focus posterolaterally to the left adjacent to C6.  No definite changes compared to the 10/30/2018 MRI.    REVIEW OF SYSTEMS: Constitutional: No fevers, chills, sweats, or change in appetite.  She notes fatigue. Eyes: No visual changes, double vision, eye pain Ear, nose and throat: No hearing loss, ear pain, nasal congestion, sore throat Cardiovascular: No chest pain, palpitations Respiratory:  No shortness of breath at rest or with exertion.   No wheezes GastrointestinaI: No nausea, vomiting, diarrhea, abdominal pain, fecal incontinence Genitourinary:  No dysuria, urinary retention or frequency.  No nocturia. Musculoskeletal:  No neck pain, back pain Integumentary: No rash, pruritus, skin lesions Neurological: as above Psychiatric: No depression at this time.  No anxiety Endocrine: No palpitations, diaphoresis, change in appetite, change in weigh or increased thirst Hematologic/Lymphatic:  No anemia, purpura, petechiae. Allergic/Immunologic: No itchy/runny eyes, nasal congestion, recent allergic reactions, rashes  ALLERGIES: No Known Allergies  HOME MEDICATIONS:  Current Outpatient Medications:    Cholecalciferol (VITAMIN D3) 50 MCG (2000 UT) capsule, Take 2,000 Units by mouth daily. , Disp: , Rfl:    Ibuprofen (ADVIL PO), Take 1 Dose by mouth as needed., Disp: , Rfl:    Loratadine 10 MG CAPS, Take by mouth., Disp: , Rfl:    magnesium gluconate (MAGONATE) 500 MG tablet, Take 500 mg by mouth at bedtime., Disp: , Rfl:    Multiple Vitamin (MULTI-VITAMIN) tablet, Take 1 tablet by mouth daily., Disp: , Rfl:    natalizumab (TYSABRI) 300 MG/15ML injection, Inject 300 mg into the vein every 28 (twenty-eight) days., Disp: , Rfl:     nortriptyline (PAMELOR) 10 MG capsule, TAKE 2 TO 3 CAPSULES BY MOUTH EVERY NIGHT AT BEDTIME, Disp: 270 capsule, Rfl: 3   solifenacin (VESICARE) 5 MG tablet, Take 1 tablet (5 mg total) by mouth daily., Disp: 90 tablet, Rfl: 3   SUMAtriptan (IMITREX) 100 MG tablet, Take 1 tablet (100 mg total) by mouth once as needed for up to 1 dose for migraine. May repeat in 2 hours if headache persists or recurs., Disp: 10 tablet, Rfl: 11   zonisamide (ZONEGRAN) 100 MG capsule, TAKE 1 CAPSULE(100 MG) BY MOUTH DAILY, Disp: 90 capsule, Rfl: 1   gabapentin (NEURONTIN) 100 MG capsule, Take 1-6 capsules (100-600 mg total) by mouth daily as needed., Disp: 540 capsule, Rfl: 3  PAST MEDICAL HISTORY: Past Medical History:  Diagnosis Date   Multiple sclerosis (HCC)    Neuromuscular disorder (HCC)     PAST SURGICAL HISTORY: Past  Surgical History:  Procedure Laterality Date   DILATION AND CURETTAGE OF UTERUS     FRACTURE SURGERY  02/2018   left ankle    FAMILY HISTORY: Family History  Problem Relation Age of Onset   Breast cancer Mother 27    SOCIAL HISTORY:  Social History   Socioeconomic History   Marital status: Married    Spouse name: Thereasa Distance   Number of children: Not on file   Years of education: Not on file   Highest education level: Not on file  Occupational History   Occupation: [employer]   Tobacco Use   Smoking status: Never   Smokeless tobacco: Never  Substance and Sexual Activity   Alcohol use: Yes    Alcohol/week: 1.0 standard drink of alcohol    Types: 1 Shots of liquor per week    Comment: rarely - about 1x a month   Drug use: Never   Sexual activity: Not on file  Other Topics Concern   Not on file  Social History Narrative   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   Right handed    Lives with husband   Social Determinants of Health   Financial Resource Strain: Not on file  Food Insecurity: Not on file  Transportation Needs: Not on file  Physical Activity: Not on file  Stress: Not on  file  Social Connections: Not on file  Intimate Partner Violence: Not on file     PHYSICAL EXAM  Vitals:   03/31/23 1054  BP: 124/81  Pulse: 83  Weight: (!) 310 lb (140.6 kg)  Height: 5\' 8"  (1.727 m)   REPEAT 155/95  Body mass index is 47.14 kg/m.   General: The patient is well-developed and well-nourished and in no acute distress.  Neck has good range of motion.  HEENT:  Head is [place]/AT.  Sclera are anicteric.      Skin: Extremities are without rash or  edema.  Neurologic Exam  Mental status: The patient is alert and oriented x 3 at the time of the examination. The patient has apparent normal recent and remote memory, with an apparently normal attention span and concentration ability.   Speech is normal.  Cranial nerves: Extraocular movements are full.  .  Facial symmetry is present. There is good facial sensation to soft touch bilaterally.Facial strength is normal.  Trapezius and sternocleidomastoid strength is normal. No dysarthria is noted.  No obvious hearing deficits are noted.  Motor:  Muscle bulk is normal.   Tone is normal. Strength is  5 / 5 in all 4 extremities.   Sensory: On sensory exam, she has mildly reduced sensation to temperature and touch in the right arm and leg relative to the left but symmetric sensation to vibration  Coordination: Cerebellar testing reveals good finger-nose-finger and heel-to-shin bilaterally.  Gait and station: Station is normal.  Gait is normal.  The tandem gait is mildly wide.  Romberg is negative. Reflexes: Deep tendon reflexes are symmetric and normal bilaterally.      DIAGNOSTIC DATA (LABS, IMAGING, TESTING) - I reviewed patient records, labs, notes, testing and imaging myself where available.  Lab Results  Component Value Date   WBC 6.0 01/02/2023   HGB 13.4 01/02/2023   HCT 42.7 01/02/2023   MCV 80 01/02/2023   PLT 229 01/02/2023       ASSESSMENT AND PLAN  1. Relapsing remitting multiple sclerosis (HCC)   2.  High risk medication use   3. Numbness and tingling in left arm  4. Urge incontinence      1.   Continue Tysabri.   MRI brain and cervical spine to determine if any progression.  Consider a different DMT if this is occurring.   2.   Continue  gabapentin 200 mg po tid  3.   For migraines:    Currently on Zonisamide, nortriptyline.  Continue  sumatriptan prn.  Try reducing nortriptyline to simplify (currently on 3 x 10 mg nightly).  4.   Oxybutynin XL 10 mg for urinary urgency caused dry mouth so we will switch to Vesicare 5 mg to see if side effects are better. 5.   BP was elevated and I recommend she discuss further with her PCP to determine if medication should be adjusted .  6.   RTC 6 months or sooner if new or worsening    Aysiah Jurado A. Epimenio Foot, MD, PhD, FAAN Certified in Neurology, Clinical Neurophysiology, Sleep Medicine, Pain Medicine and Neuroimaging Director, [facility]  [facility] [address] [place], [place] [address] [phone]  Addendum:

## 2023-04-01 ENCOUNTER — Telehealth: Payer: Self-pay | Admitting: Neurology

## 2023-04-01 NOTE — Telephone Encounter (Signed)
MR brain w/wo & MR cervical w/wo sent to GI for scheduling, they obtain [employer] auth. [id number] [id number]

## 2023-04-06 ENCOUNTER — Encounter: Payer: Self-pay | Admitting: Neurology

## 2023-04-07 ENCOUNTER — Other Ambulatory Visit: Payer: Self-pay

## 2023-04-07 MED ORDER — NORTRIPTYLINE HCL 10 MG PO CAPS: ORAL_CAPSULE | ORAL | 3 refills | Status: DC

## 2023-04-07 MED ORDER — ZONISAMIDE 100 MG PO CAPS: ORAL_CAPSULE | ORAL | 1 refills | Status: DC

## 2023-04-08 ENCOUNTER — Encounter: Payer: Self-pay | Admitting: Neurology

## 2023-04-11 ENCOUNTER — Other Ambulatory Visit: Payer: Self-pay | Admitting: Neurology

## 2023-04-28 DIAGNOSIS — G35 Multiple sclerosis: Secondary | ICD-10-CM | POA: Diagnosis not present

## 2023-04-30 ENCOUNTER — Other Ambulatory Visit: Payer: Self-pay | Admitting: Neurology

## 2023-04-30 ENCOUNTER — Ambulatory Visit
Admission: RE | Admit: 2023-04-30 | Discharge: 2023-04-30 | Disposition: A | Discharge status: Home / Self Care | Payer: 59 | Source: Ambulatory Visit | Attending: Neurology | Admitting: Neurology

## 2023-04-30 ENCOUNTER — Other Ambulatory Visit: Payer: Self-pay

## 2023-04-30 DIAGNOSIS — R200 Anesthesia of skin: Secondary | ICD-10-CM

## 2023-04-30 DIAGNOSIS — N3941 Urge incontinence: Secondary | ICD-10-CM

## 2023-04-30 DIAGNOSIS — G35 Multiple sclerosis: Secondary | ICD-10-CM

## 2023-04-30 MED ORDER — GADOPICLENOL 0.5 MMOL/ML IV SOLN
10.0000 mL | Freq: Once | INTRAVENOUS | Status: AC | PRN
  Administered 2023-04-30: 10 mL via INTRAVENOUS

## 2023-05-01 DIAGNOSIS — Z833 Family history of diabetes mellitus: Secondary | ICD-10-CM | POA: Diagnosis not present

## 2023-05-01 DIAGNOSIS — D8481 Immunodeficiency due to conditions classified elsewhere: Secondary | ICD-10-CM | POA: Diagnosis not present

## 2023-05-01 DIAGNOSIS — D84821 Immunodeficiency due to drugs: Secondary | ICD-10-CM | POA: Diagnosis not present

## 2023-05-01 DIAGNOSIS — Z8249 Family history of ischemic heart disease and other diseases of the circulatory system: Secondary | ICD-10-CM | POA: Diagnosis not present

## 2023-05-01 DIAGNOSIS — I10 Essential (primary) hypertension: Secondary | ICD-10-CM | POA: Diagnosis not present

## 2023-05-01 DIAGNOSIS — N3941 Urge incontinence: Secondary | ICD-10-CM | POA: Diagnosis not present

## 2023-05-01 DIAGNOSIS — R519 Headache, unspecified: Secondary | ICD-10-CM | POA: Diagnosis not present

## 2023-05-01 DIAGNOSIS — Z809 Family history of malignant neoplasm, unspecified: Secondary | ICD-10-CM | POA: Diagnosis not present

## 2023-05-01 DIAGNOSIS — G35 Multiple sclerosis: Secondary | ICD-10-CM | POA: Diagnosis not present

## 2023-05-01 DIAGNOSIS — Z6841 Body Mass Index (BMI) 40.0 and over, adult: Secondary | ICD-10-CM | POA: Diagnosis not present

## 2023-05-01 DIAGNOSIS — K5900 Constipation, unspecified: Secondary | ICD-10-CM | POA: Diagnosis not present

## 2023-06-26 ENCOUNTER — Other Ambulatory Visit: Payer: Self-pay | Admitting: *Deleted

## 2023-06-26 ENCOUNTER — Other Ambulatory Visit: Payer: Self-pay

## 2023-06-26 ENCOUNTER — Telehealth: Payer: Self-pay

## 2023-06-26 DIAGNOSIS — Z79899 Other long term (current) drug therapy: Secondary | ICD-10-CM | POA: Diagnosis not present

## 2023-06-26 DIAGNOSIS — G35 Multiple sclerosis: Secondary | ICD-10-CM | POA: Diagnosis not present

## 2023-06-26 NOTE — Telephone Encounter (Signed)
Placed JCV in [facility] 06/26/2023

## 2023-06-27 LAB — CBC WITH DIFFERENTIAL/PLATELET
Basophils Absolute: 0 10*3/uL (ref 0.0–0.2)
Basos: 1 %
EOS (ABSOLUTE): 0.2 10*3/uL (ref 0.0–0.4)
Eos: 4 %
Hematocrit: 40 % (ref 34.0–46.6)
Hemoglobin: 12.3 g/dL (ref 11.1–15.9)
Immature Grans (Abs): 0 10*3/uL (ref 0.0–0.1)
Immature Granulocytes: 1 %
Lymphocytes Absolute: 1.8 10*3/uL (ref 0.7–3.1)
Lymphs: 33 %
MCH: 25.4 pg — ABNORMAL LOW (ref 26.6–33.0)
MCHC: 30.8 g/dL — ABNORMAL LOW (ref 31.5–35.7)
MCV: 83 fL (ref 79–97)
Monocytes Absolute: 0.5 10*3/uL (ref 0.1–0.9)
Monocytes: 10 %
Neutrophils Absolute: 2.8 10*3/uL (ref 1.4–7.0)
Neutrophils: 51 %
Platelets: 231 10*3/uL (ref 150–450)
RBC: 4.85 x10E6/uL (ref 3.77–5.28)
RDW: 14.2 % (ref 11.7–15.4)
WBC: 5.4 10*3/uL (ref 3.4–10.8)

## 2023-06-27 LAB — SPECIMEN STATUS REPORT

## 2023-07-07 NOTE — Telephone Encounter (Signed)
JCV 0.21 index, Antibody inhibition assay negative. [facility].

## 2023-09-30 ENCOUNTER — Encounter: Payer: Self-pay | Admitting: Hematology and Oncology

## 2023-10-07 ENCOUNTER — Encounter: Payer: Self-pay | Admitting: Neurology

## 2023-10-07 ENCOUNTER — Encounter: Payer: Self-pay | Admitting: Hematology and Oncology

## 2023-10-07 ENCOUNTER — Ambulatory Visit (INDEPENDENT_AMBULATORY_CARE_PROVIDER_SITE_OTHER): Payer: BC Managed Care – PPO | Admitting: Neurology

## 2023-10-07 VITALS — BP 140/93 | HR 74 | Ht 68.0 in | Wt 307.0 lb

## 2023-10-07 DIAGNOSIS — R202 Paresthesia of skin: Secondary | ICD-10-CM

## 2023-10-07 DIAGNOSIS — R200 Anesthesia of skin: Secondary | ICD-10-CM

## 2023-10-07 DIAGNOSIS — N3941 Urge incontinence: Secondary | ICD-10-CM | POA: Diagnosis not present

## 2023-10-07 DIAGNOSIS — Z79899 Other long term (current) drug therapy: Secondary | ICD-10-CM

## 2023-10-07 DIAGNOSIS — G35 Multiple sclerosis: Secondary | ICD-10-CM

## 2023-10-07 DIAGNOSIS — G4489 Other headache syndrome: Secondary | ICD-10-CM

## 2023-10-07 DIAGNOSIS — M79602 Pain in left arm: Secondary | ICD-10-CM

## 2023-10-07 DIAGNOSIS — M79601 Pain in right arm: Secondary | ICD-10-CM

## 2023-10-07 NOTE — Progress Notes (Signed)
[facility]  PATIENT: Nancy Jennings DOB: Aug 24, 1981  REFERRING DOCTOR OR PCP:  [name], NP (PCP); Dr. [name] (Neurology) SOURCE: Patient, notes from Dr. [name], imaging and laboratory reports,  _________________________________   HISTORICAL  CHIEF COMPLAINT:  Chief Complaint  Patient presents with   Room 11    Pt is here Alone. Pt states that she has been going good with her MS since her last appointment.     HISTORY OF PRESENT ILLNESS:  Nancy Jennings is a 42 year old woman with relapsing remitting multiple sclerosis.  Update12/17/2024: She is on Tysabri.   JCV Ab is negative 0.21 (06/26/2023)   She tolerates it well and has not had any definite exacerbations.  MRIs in 04/30/23 of brain nad cervical spine showed no new lesions.    She is  neurologically stable and has no exacerbation or new symptoms..   She could easily walk 3 miles.   She keeps up with others.  Balance is stable but she usually uses the bannister downstairs.    Strength is normal.    She reports dysaesthetitic pain in the arms, L>R and left leg. .    Gabapentin has helped the dysesthesias.   She is now taking it  200 - 100 - 300 mg po over the day (5-6 pills).   She denies definite weakness in her arms and legs.      Dysphagia resolved with OTC Prilosec (she also had GERD).   Her voice is fine.     She has urinary urgency with rare urge incontinence much better on Vesicare.  Less dry mouth than oxybutynin.    She has had only a couple bad migraines since the last visit.     She has occasional milder ones and will take an NSAID.   She is on nortriptyline and zonisamide for prophylaxis.    She tried stopping zonisamide but HA returned so she restarted it.  She has not taken sumatriptan in about a year.  She notes fatigue.   She sleeps well most nights.     MS History: She was diagnosed with MS in late 2019.  In September 2019, she had the onset of left neck pain and  then had numbness and pain that went down the left arm and then right arm and then right leg.  Symptoms evolved over 1-2 days.   Due to discomfort, she had trouble sleeping.   She saw her PCP a few days later.   She was referred her to [facility] and saw Dr. [name].   Her symptoms had already resolved over 2-3 weeks but she continued to have painful tingling in the left arm that persisted.   She saw Dr. [name] in October and had a NCV/EMG in [address].     Due to continued symptoms, she had a brain MRI.    It was consistent with MS and an MRI of the cervical spine was also ordered.   It showed a lesion to the left at C2 and a second lesion ventral midline at C5C6.   Her MRIs were done at [facility].  She was started on  Tysabri.   She is JCV Ab negative.    MRI 1/102/20:  Cervical spinal cord: Ovoid, slightly expansile T2-hyperintense lesion in the left side of the spinal cord at the C2 vertebral level, measuring approximately 13 mm craniocaudal. Similar smaller, more subtle lesion in the ventral midline spinal cord at C6  MRI 10/16/2018 Brain:  Multiple (greater than 10) T2/FLAIR-hyperintense white matter lesions in both cerebral hemispheres, both periventricular and juxtacortical, as well as, to a lesser extent, in both cerebellar hemispheres.  The brain volume is normal.   Incidental right frontal developmental venous anomaly.  MRI 10/18/2020 showed multiple T2/FLAIR hyperintense foci in the hemispheres and also a focus in the left cerebellar hemisphere and in the spinal cord.  These are consistent with chronic demyelinating plaque associated with multiple sclerosis.  None of the foci appears to be acute.  However, one focus in the left frontal juxtacortical white matter was not present on the previous MRI and likely occurred during the 1 year interval.  MRI 12/26/2020 showed  multiple T2/FLAIR hyperintense foci in the cerebral hemispheres and left cerebellar hemisphere in a pattern and  configuration consistent with chronic demyelinating plaque associated with multiple sclerosis.  None of the foci appear to be acute.  Compared to the MRI from 10/19/2019, there are no new lesions.  MRI cervical spine 12/26/2020  showed foci within the spinal cord to the left adjacent to C2 and centrally adjacent to C5-C6.  Possible very small focus posterolaterally to the left adjacent to C6.  No definite changes compared to the 10/30/2018 MRI.  MRi brain 04/30/23 was unchanged  MRI cervical spine 04/30/2023 was unchanged    REVIEW OF SYSTEMS: Constitutional: No fevers, chills, sweats, or change in appetite.  She notes fatigue. Eyes: No visual changes, double vision, eye pain Ear, nose and throat: No hearing loss, ear pain, nasal congestion, sore throat Cardiovascular: No chest pain, palpitations Respiratory:  No shortness of breath at rest or with exertion.   No wheezes GastrointestinaI: No nausea, vomiting, diarrhea, abdominal pain, fecal incontinence Genitourinary:  No dysuria, urinary retention or frequency.  No nocturia. Musculoskeletal:  No neck pain, back pain Integumentary: No rash, pruritus, skin lesions Neurological: as above Psychiatric: No depression at this time.  No anxiety Endocrine: No palpitations, diaphoresis, change in appetite, change in weigh or increased thirst Hematologic/Lymphatic:  No anemia, purpura, petechiae. Allergic/Immunologic: No itchy/runny eyes, nasal congestion, recent allergic reactions, rashes  ALLERGIES: No Known Allergies  HOME MEDICATIONS:  Current Outpatient Medications:    Cholecalciferol (VITAMIN D3) 50 MCG (2000 UT) capsule, Take 2,000 Units by mouth daily. , Disp: , Rfl:    gabapentin (NEURONTIN) 100 MG capsule, Take 1-6 capsules (100-600 mg total) by mouth daily as needed., Disp: 540 capsule, Rfl: 3   Ibuprofen (ADVIL PO), Take 1 Dose by mouth as needed., Disp: , Rfl:    Loratadine 10 MG CAPS, Take by mouth., Disp: , Rfl:    Magnesium  Glycinate 100 MG CAPS, Take 200 mg by mouth 2 (two) times daily., Disp: , Rfl:    Multiple Vitamin (MULTI-VITAMIN) tablet, Take 1 tablet by mouth daily., Disp: , Rfl:    natalizumab (TYSABRI) 300 MG/15ML injection, Inject 300 mg into the vein every 28 (twenty-eight) days., Disp: , Rfl:    nortriptyline (PAMELOR) 10 MG capsule, TAKE 2 TO 3 CAPSULES BY MOUTH EVERY NIGHT AT BEDTIME, Disp: 270 capsule, Rfl: 3   solifenacin (VESICARE) 5 MG tablet, Take 1 tablet (5 mg total) by mouth daily., Disp: 90 tablet, Rfl: 3   SUMAtriptan (IMITREX) 100 MG tablet, Take 1 tablet (100 mg total) by mouth once as needed for up to 1 dose for migraine. May repeat in 2 hours if headache persists or recurs., Disp: 10 tablet, Rfl: 11   zonisamide (ZONEGRAN) 100 MG capsule, TAKE 1 CAPSULE(100 MG) BY MOUTH DAILY, Disp:  90 capsule, Rfl: 1   magnesium gluconate (MAGONATE) 500 MG tablet, Take 500 mg by mouth at bedtime. (Patient not taking: Reported on 10/07/2023), Disp: , Rfl:   PAST MEDICAL HISTORY: Past Medical History:  Diagnosis Date   Multiple sclerosis (HCC)    Neuromuscular disorder (HCC)     PAST SURGICAL HISTORY: Past Surgical History:  Procedure Laterality Date   DILATION AND CURETTAGE OF UTERUS     FRACTURE SURGERY  02/2018   left ankle    FAMILY HISTORY: Family History  Problem Relation Age of Onset   Breast cancer Mother 59    SOCIAL HISTORY:  Social History   Socioeconomic History   Marital status: Married    Spouse name: Thereasa Distance   Number of children: Not on file   Years of education: Not on file   Highest education level: Not on file  Occupational History   Occupation: [employer]   Tobacco Use   Smoking status: Never   Smokeless tobacco: Never  Substance and Sexual Activity   Alcohol use: Yes    Alcohol/week: 1.0 standard drink of alcohol    Types: 1 Shots of liquor per week    Comment: rarely - about 1x a month   Drug use: Never   Sexual activity: Not on file  Other Topics Concern    Not on file  Social History Narrative   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   Right handed    Lives with husband   Social Drivers of Health   Financial Resource Strain: Low Risk  (10/07/2022)   Received from [facility], [facility]   Overall Financial Resource Strain (CARDIA)    Difficulty of Paying Living Expenses: Not very hard  Food Insecurity: No Food Insecurity (10/07/2022)   Received from [facility], [facility]   Hunger Vital Sign    Worried About Running Out of Food in the Last Year: Never true    Ran Out of Food in the Last Year: Never true  Transportation Needs: No Transportation Needs (10/07/2022)   Received from [facility], [facility]   [facility]    In the past 12 months, has lack of transportation kept you from medical appointments or from getting medications?: No    Lack of Transportation (Non-Medical): No  Physical Activity: Not on file  Stress: Not on file (12/01/2019)  Social Connections: Not on file  Intimate Partner Violence: Low Risk  (01/31/2020)   Received from [facility]   Intimate Partner Violence     PHYSICAL EXAM  Vitals:   10/07/23 1135  BP: (!) 140/93  Pulse: 74  Weight: (!) 307 lb (139.3 kg)  Height: 5\' 8"  (1.727 m)   REPEAT 155/95  Body mass index is 46.68 kg/m.   General: The patient is well-developed and well-nourished and in no acute distress.  Neck has good range of motion.  HEENT:  Head is [place]/AT.  Sclera are anicteric.      Skin: Extremities are without rash or  edema.  Neurologic Exam  Mental status: The patient is alert and oriented x 3 at the time of the examination. The patient has apparent normal recent and remote memory, with an apparently normal attention span and concentration ability.   Speech is normal.  Cranial nerves: Extraocular movements are full.  .  Facial symmetry is present. There is good  facial sensation to soft touch bilaterally.Facial strength is normal.  Trapezius and  sternocleidomastoid strength is normal. No dysarthria is noted.  No obvious hearing deficits are noted.  Motor:  Muscle bulk is normal.   Tone is normal. Strength is  5 / 5 in all 4 extremities.   Sensory: On sensory exam, she has symmetric sensation to touch and vibration today  Coordination: Cerebellar testing reveals good finger-nose-finger and heel-to-shin bilaterally.  Gait and station: Station is normal.  Gait is normal.  The tandem gait is mildly wide.  Romberg is negative.  Reflexes: Deep tendon reflexes are symmetric and normal bilaterally.      DIAGNOSTIC DATA (LABS, IMAGING, TESTING) - I reviewed patient records, labs, notes, testing and imaging myself where available.  Lab Results  Component Value Date   WBC 5.4 06/26/2023   HGB 12.3 06/26/2023   HCT 40.0 06/26/2023   MCV 83 06/26/2023   PLT 231 06/26/2023       ASSESSMENT AND PLAN  1. Relapsing remitting multiple sclerosis (HCC)   2. High risk medication use   3. Numbness and tingling in left arm   4. Urge incontinence   5. Other headache syndrome   6. Bilateral arm pain     1.   Continue Tysabri.   Check JCV Ab q 6 months.   2.   Continue  gabapentin 200 mg po tid  3.   For migraines:    continue on Zonisamide, nortriptyline.  Continue  sumatriptan prn.    4.    Vesicare 5 mg  for urinary dysfunction 5.    Advised to lose weight and keep BP under control 6.    RTC 6 months or sooner if new or worsening    Jaia Alonge A. Epimenio Foot, MD, PhD, FAAN Certified in Neurology, Clinical Neurophysiology, Sleep Medicine, Pain Medicine and Neuroimaging Director, [facility] at [facility]  [facility] [address], [address] [place], [place] [address] [phone]  Addendum:

## 2023-10-17 ENCOUNTER — Other Ambulatory Visit: Payer: Self-pay | Admitting: Neurology

## 2023-11-11 ENCOUNTER — Other Ambulatory Visit: Payer: Self-pay | Admitting: Neurology

## 2023-11-11 NOTE — Telephone Encounter (Signed)
Rx sent for 90 day supply per last office visit note: "For migraines:    continue on Zonisamide, nortriptyline.  Continue  sumatriptan prn."

## 2023-11-12 ENCOUNTER — Other Ambulatory Visit: Payer: Self-pay | Admitting: Neurology

## 2023-11-13 ENCOUNTER — Other Ambulatory Visit: Payer: Self-pay

## 2023-11-13 NOTE — Telephone Encounter (Signed)
90 days supply was sent back on 03/31/23 #90, 3 refills. Should be on file.

## 2023-12-31 ENCOUNTER — Telehealth: Payer: Self-pay | Admitting: Family Medicine

## 2023-12-31 NOTE — Telephone Encounter (Signed)
 LVM and sent mychart msg informing pt of need to reschedule 04/22/24 appt - NP out

## 2024-01-05 ENCOUNTER — Other Ambulatory Visit: Payer: Self-pay

## 2024-01-05 ENCOUNTER — Telehealth: Payer: Self-pay | Admitting: *Deleted

## 2024-01-05 ENCOUNTER — Other Ambulatory Visit: Payer: Self-pay | Admitting: *Deleted

## 2024-01-05 DIAGNOSIS — Z79899 Other long term (current) drug therapy: Secondary | ICD-10-CM

## 2024-01-05 DIAGNOSIS — G35 Multiple sclerosis: Secondary | ICD-10-CM

## 2024-01-05 NOTE — Telephone Encounter (Signed)
 Placed JCV lab in [facility] lock box for routine lab pick up. Results pending.

## 2024-01-06 LAB — CBC WITH DIFFERENTIAL/PLATELET
Basophils Absolute: 0.1 10*3/uL (ref 0.0–0.2)
Basos: 1 %
EOS (ABSOLUTE): 0.3 10*3/uL (ref 0.0–0.4)
Eos: 5 %
Hematocrit: 40.8 % (ref 34.0–46.6)
Hemoglobin: 12.7 g/dL (ref 11.1–15.9)
Immature Grans (Abs): 0 10*3/uL (ref 0.0–0.1)
Immature Granulocytes: 0 %
Lymphocytes Absolute: 2.4 10*3/uL (ref 0.7–3.1)
Lymphs: 41 %
MCH: 25.3 pg — ABNORMAL LOW (ref 26.6–33.0)
MCHC: 31.1 g/dL — ABNORMAL LOW (ref 31.5–35.7)
MCV: 81 fL (ref 79–97)
Monocytes Absolute: 0.4 10*3/uL (ref 0.1–0.9)
Monocytes: 7 %
Neutrophils Absolute: 2.7 10*3/uL (ref 1.4–7.0)
Neutrophils: 46 %
Platelets: 190 10*3/uL (ref 150–450)
RBC: 5.01 x10E6/uL (ref 3.77–5.28)
RDW: 14.4 % (ref 11.7–15.4)
WBC: 5.8 10*3/uL (ref 3.4–10.8)

## 2024-01-12 NOTE — Telephone Encounter (Signed)
 Marland Kitchen

## 2024-04-01 ENCOUNTER — Other Ambulatory Visit: Payer: Self-pay | Admitting: Neurology

## 2024-04-01 NOTE — Telephone Encounter (Signed)
 Last seen on 10/07/23 Follow up scheduled on 04/07/24

## 2024-04-02 ENCOUNTER — Other Ambulatory Visit: Payer: Self-pay | Admitting: Neurology

## 2024-04-06 NOTE — Telephone Encounter (Signed)
 Last seen on 10/07/23 Follow up scheduled on 04/07/24

## 2024-04-06 NOTE — Patient Instructions (Signed)
 Below is our plan:  We will continue current treatment plan. We will keep a close eye on JCV. Let me know if you need me!  Please make sure you are staying well hydrated. I recommend 50-60 ounces daily. Well balanced diet and regular exercise encouraged. Consistent sleep schedule with 6-8 hours recommended.   Please continue follow up with care team as directed.   Follow up with Dr Godwin Lat in 6 months   You may receive a survey regarding today's visit. I encourage you to leave honest feed back as I do use this information to improve patient care. Thank you for seeing me today!

## 2024-04-06 NOTE — Progress Notes (Signed)
 Chief Complaint  Patient presents with   Follow-up    Pt in room 2. Husband Charlann Confer. Here for MS, DMT: Tysabri , Next infusion date: 04/08/2024. Patient reports doing well, things are stable. No fall, eye exam was last year.    HISTORY OF PRESENT ILLNESS:  04/07/24 ALL:  Nancy Jennings is a 43 y.o. female here today for follow up for RRMS. She continues Tysabri  infusions monthly. JCV 0.28, intermediate, 12/2023. Last MRI brain stable and cervical spine 04/2023 showed no changes from prior imaging in 2022.  She feels that she is doing fairly well. She denies changes in gait. She is active at work. 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   She continues gabapentin  200mg  in the morning and 300mg  at bedtime for left arm dysesthesias. It does seem to help. She was started on nortriptyline  20-30mg  daily t bedtime for nerve pain but has been continued for headaches as well. She continues zonisamide  100mg  daily for headache prevention. She tried to decrease nortriptyline  but headaches worsened. She feels headaches are fairly stable. She averages one a month. Usually generalized achy pain. No light or sound sensitivity. 2-3 a month will wake her up from sleep. Sumatriptan  works but makes her sleepy. She had not taken recently.  BP has been better. Usually 115-120/80s. He father had OSA.   She continues to have fatigue. She is sleeping ok. She feels like she rests well and wakes refreshed when she sleeps long enough. She does not snore.    Vesicare  helps significantly with urinary frequency and urgency.    HISTORY (copied from Dr Thom Fleeting previous note)  Nancy Jennings is a 43 year old woman with relapsing remitting multiple sclerosis.   Update12/17/2024: She is on Tysabri .   JCV Ab is negative 0.21 (06/26/2023)   She tolerates it well and has not had any definite exacerbations.  MRIs in 04/30/23 of brain nad cervical spine showed no new lesions.     She  is  neurologically stable and has no exacerbation or new symptoms..   She could easily walk 3 miles.   She keeps up with others.  Balance is stable but she usually uses the bannister downstairs.    Strength is normal.    She reports dysaesthetitic pain in the arms, L>R and left leg. .    Gabapentin  has helped the dysesthesias.   She is now taking it  200 - 100 - 300 mg po over the day (5-6 pills).   She denies definite weakness in her arms and legs.       Dysphagia resolved with OTC Prilosec (she also had GERD).   Her voice is fine.      She has urinary urgency with rare urge incontinence much better on Vesicare .  Less dry mouth than oxybutynin .     She has had only a couple bad migraines since the last visit.     She has occasional milder ones and will take an NSAID.   She is on nortriptyline  and zonisamide  for prophylaxis.    She tried stopping zonisamide  but HA returned so she restarted it.  She has not taken sumatriptan  in about a year.   She notes fatigue.   She sleeps well most nights.      MS History: She was diagnosed with MS in late 2019.  In September 2019, she had the onset of left neck pain and then had numbness and pain that went down  the left arm and then right arm and then right leg.  Symptoms evolved over 1-2 days.   Due to discomfort, she had trouble sleeping.   She saw her PCP a few days later.   She was referred her to [facility] and saw Dr. Walden Guise.   Her symptoms had already resolved over 2-3 weeks but she continued to have painful tingling in the left arm that persisted.   She saw Dr. Walden Guise in October and had a NCV/EMG in [place].     Due to continued symptoms, she had a brain MRI.    It was consistent with MS and an MRI of the cervical spine was also ordered.   It showed a lesion to the left at C2 and a second lesion ventral midline at C5C6.   Her MRIs were done at [facility].  She was started on  Tysabri .   She is JCV Ab negative.    MRI 1/102/20:  Cervical  spinal cord: Ovoid, slightly expansile T2-hyperintense lesion in the left side of the spinal cord at the C2 vertebral level, measuring approximately 13 mm craniocaudal. Similar smaller, more subtle lesion in the ventral midline spinal cord at C6   MRI 10/16/2018 Brain: Multiple (greater than 10) T2/FLAIR-hyperintense white matter lesions in both cerebral hemispheres, both periventricular and juxtacortical, as well as, to a lesser extent, in both cerebellar hemispheres.  The brain volume is normal.   Incidental right frontal developmental venous anomaly.   MRI 10/18/2020 showed multiple T2/FLAIR hyperintense foci in the hemispheres and also a focus in the left cerebellar hemisphere and in the spinal cord.  These are consistent with chronic demyelinating plaque associated with multiple sclerosis.  None of the foci appears to be acute.  However, one focus in the left frontal juxtacortical white matter was not present on the previous MRI and likely occurred during the 1 year interval.   MRI 12/26/2020 showed  multiple T2/FLAIR hyperintense foci in the cerebral hemispheres and left cerebellar hemisphere in a pattern and configuration consistent with chronic demyelinating plaque associated with multiple sclerosis.  None of the foci appear to be acute.  Compared to the MRI from 10/19/2019, there are no new lesions.   MRI cervical spine 12/26/2020  showed foci within the spinal cord to the left adjacent to C2 and centrally adjacent to C5-C6.  Possible very small focus posterolaterally to the left adjacent to C6.  No definite changes compared to the 10/30/2018 MRI.   MRi brain 04/30/23 was unchanged   MRI cervical spine 04/30/2023 was unchanged   REVIEW OF SYSTEMS: Out of a complete 14 system review of symptoms, the patient complains only of the following symptoms, dysesthesias, fatigue, headaches and all other reviewed systems are negative.   ALLERGIES: No Known Allergies   HOME MEDICATIONS: Outpatient  Medications Prior to Visit  Medication Sig Dispense Refill   Cholecalciferol (VITAMIN D3) 50 MCG (2000 UT) capsule Take 2,000 Units by mouth daily.      gabapentin  (NEURONTIN ) 100 MG capsule TAKE 1-6 CAPSULES (100-600 MG TOTAL) BY MOUTH DAILY AS NEEDED. 180 capsule 1   Ibuprofen  (ADVIL  PO) Take 1 Dose by mouth as needed.     Loratadine 10 MG CAPS Take by mouth.     Magnesium Glycinate 100 MG CAPS Take 200 mg by mouth 2 (two) times daily.     Multiple Vitamin (MULTI-VITAMIN) tablet Take 1 tablet by mouth daily.     natalizumab  (TYSABRI ) 300 MG/15ML injection Inject 300 mg into the  vein every 28 (twenty-eight) days.     SUMAtriptan  (IMITREX ) 100 MG tablet Take 1 tablet (100 mg total) by mouth once as needed for up to 1 dose for migraine. May repeat in 2 hours if headache persists or recurs. 10 tablet 11   nortriptyline  (PAMELOR ) 10 MG capsule TAKE 2 TO 3 CAPSULES BY MOUTH EVERY NIGHT AT BEDTIME 270 capsule 3   solifenacin  (VESICARE ) 5 MG tablet TAKE 1 TABLET (5 MG TOTAL) BY MOUTH DAILY. 30 tablet 0   zonisamide  (ZONEGRAN ) 100 MG capsule TAKE 1 CAPSULE(100 MG) BY MOUTH DAILY 90 capsule 2   magnesium gluconate (MAGONATE) 500 MG tablet Take 500 mg by mouth at bedtime. (Patient not taking: Reported on 04/07/2024)     No facility-administered medications prior to visit.     PAST MEDICAL HISTORY: Past Medical History:  Diagnosis Date   Multiple sclerosis (HCC)    Neuromuscular disorder (HCC)      PAST SURGICAL HISTORY: Past Surgical History:  Procedure Laterality Date   DILATION AND CURETTAGE OF UTERUS     FRACTURE SURGERY  02/2018   left ankle     FAMILY HISTORY: Family History  Problem Relation Age of Onset   Breast cancer Mother 58     SOCIAL HISTORY: Social History   Socioeconomic History   Marital status: Married    Spouse name: Charlann Confer   Number of children: Not on file   Years of education: Not on file   Highest education level: Not on file  Occupational History    Occupation: [employer]   Tobacco Use   Smoking status: Never   Smokeless tobacco: Never  Vaping Use   Vaping status: Never Used  Substance and Sexual Activity   Alcohol use: Yes    Alcohol/week: 1.0 standard drink of alcohol    Types: 1 Shots of liquor per week    Comment: rarely - about 1x a month   Drug use: Never   Sexual activity: Not on file  Other Topics Concern   Not on file  Social History Narrative   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   Right handed    Lives with husband   Social Drivers of Health   Financial Resource Strain: Low Risk  (03/01/2024)   Received from [facility]   Overall Financial Resource Strain (CARDIA)    Difficulty of Paying Living Expenses: Not very hard  Food Insecurity: No Food Insecurity (03/01/2024)   Received from [facility]   Hunger Vital Sign    Within the past 12 months, you worried that your food would run out before you got the money to buy more.: Never true    Within the past 12 months, the food you bought just didn't last and you didn't have money to get more.: Never true  Transportation Needs: No Transportation Needs (03/01/2024)   Received from [facility] - Transportation    In the past 12 months, has lack of transportation kept you from medical appointments or from getting medications?: No    Lack of Transportation (Non-Medical): No  Physical Activity: Not on file  Stress: Not on file (12/01/2019)  Social Connections: Not on file  Intimate Partner Violence: Low Risk  (01/31/2020)   Received from [facility]   Intimate Partner Violence     PHYSICAL EXAM  Vitals:   04/07/24 0835  BP: 115/80  Pulse: 84  SpO2: 96%  Weight: (!) 303 lb 8 oz (137.7 kg)  Height: 5' 8 (1.727 m)    Body mass index is 46.15 kg/m.   Generalized: Well developed, in no acute distress  Cardiology: normal rate and rhythm, no murmur auscultated  Respiratory: clear to auscultation bilaterally     Neurological examination  Mentation: Alert oriented to time, place, history taking. Follows all commands speech and language fluent Cranial nerve II-XII: Pupils were equal round reactive to light. Extraocular movements were full, visual field were full on confrontational test. Facial sensation and strength were normal. Head turning and shoulder shrug  were normal and symmetric. Motor: The motor testing reveals 5 over 5 strength of all 4 extremities. Good symmetric motor tone is noted throughout.  Sensory: Sensory testing is intact to soft touch on all 4 extremities. No evidence of extinction is noted.  Coordination: Cerebellar testing reveals good finger-nose-finger and heel-to-shin bilaterally.  Gait and station: Gait is normal.      DIAGNOSTIC DATA (LABS, IMAGING, TESTING) - I reviewed patient records, labs, notes, testing and imaging myself where available.  Lab Results  Component Value Date   WBC 5.8 01/05/2024   HGB 12.7 01/05/2024   HCT 40.8 01/05/2024   MCV 81 01/05/2024   PLT 190 01/05/2024   No results found for: NA, K, CL, CO2, GLUCOSE, BUN, CREATININE, CALCIUM, PROT, ALBUMIN, AST, ALT, ALKPHOS, BILITOT, GFRNONAA, GFRAA No results found for: CHOL, HDL, LDLCALC, LDLDIRECT, TRIG, CHOLHDL No results found for: ZOXW9U No results found for: VITAMINB12 No results found for: TSH      No data to display               No data to display           ASSESSMENT AND PLAN  43 y.o. year old female  has a past medical history of Multiple sclerosis (HCC) and Neuromuscular disorder (HCC). here with    Relapsing remitting multiple sclerosis (HCC)  High risk medication use  Numbness and tingling in left arm  Urge incontinence  Other headache syndrome  Bilateral arm pain  Naraya feels MS symptoms are stable, today. We will continue Tysabri  infusions monthly. She had labs with last infusion 12/2023. CBC  unremarkable. JCV .28. Will monitor closely. She will continue gabapentin , nortriptyline  and zonisamide  as prescribed. She will focus on healthy lifestyle habits. She will follow up with Dr Godwin Lat in 6 months. She verbalizes understanding and agreement with this plan.    No orders of the defined types were placed in this encounter.     Meds ordered this encounter  Medications   solifenacin  (VESICARE ) 5 MG tablet    Sig: Take 1 tablet (5 mg total) by mouth daily.    Dispense:  90 tablet    Refill:  1    Supervising Provider:   AHERN, ANTONIA B [id number]   zonisamide  (ZONEGRAN ) 100 MG capsule    Sig: TAKE 1 CAPSULE(100 MG) BY MOUTH DAILY    Dispense:  90 capsule    Refill:  3    Supervising Provider:   AHERN, ANTONIA B [[id number]]   nortriptyline  (PAMELOR ) 10 MG capsule    Sig: TAKE 2 TO 3 CAPSULES BY MOUTH EVERY NIGHT AT BEDTIME    Dispense:  270 capsule    Refill:  3    Supervising Provider:   AHERN, ANTONIA B [[id number]]       Terrilyn Fick, MSN, FNP-C 04/07/2024, 9:03 AM  [facility] [address] [place], [place] [address] [phone]

## 2024-04-07 ENCOUNTER — Encounter: Payer: Self-pay | Admitting: Family Medicine

## 2024-04-07 ENCOUNTER — Ambulatory Visit (INDEPENDENT_AMBULATORY_CARE_PROVIDER_SITE_OTHER): Admitting: Family Medicine

## 2024-04-07 VITALS — BP 115/80 | HR 84 | Ht 68.0 in | Wt 303.5 lb

## 2024-04-07 DIAGNOSIS — G35 Multiple sclerosis: Secondary | ICD-10-CM

## 2024-04-07 DIAGNOSIS — Z79899 Other long term (current) drug therapy: Secondary | ICD-10-CM | POA: Diagnosis not present

## 2024-04-07 DIAGNOSIS — N3941 Urge incontinence: Secondary | ICD-10-CM | POA: Diagnosis not present

## 2024-04-07 DIAGNOSIS — G4489 Other headache syndrome: Secondary | ICD-10-CM

## 2024-04-07 DIAGNOSIS — M79601 Pain in right arm: Secondary | ICD-10-CM

## 2024-04-07 DIAGNOSIS — R202 Paresthesia of skin: Secondary | ICD-10-CM

## 2024-04-07 DIAGNOSIS — R200 Anesthesia of skin: Secondary | ICD-10-CM

## 2024-04-07 DIAGNOSIS — M79602 Pain in left arm: Secondary | ICD-10-CM

## 2024-04-07 MED ORDER — ZONISAMIDE 100 MG PO CAPS: ORAL_CAPSULE | ORAL | 3 refills | Status: AC

## 2024-04-07 MED ORDER — SOLIFENACIN SUCCINATE 5 MG PO TABS: 5.0000 mg | ORAL_TABLET | Freq: Every day | ORAL | 1 refills | Status: DC

## 2024-04-07 MED ORDER — NORTRIPTYLINE HCL 10 MG PO CAPS: ORAL_CAPSULE | ORAL | 3 refills | Status: AC

## 2024-04-15 ENCOUNTER — Encounter: Payer: Self-pay | Admitting: Hematology and Oncology

## 2024-04-22 ENCOUNTER — Ambulatory Visit: Payer: BC Managed Care – PPO | Admitting: Family Medicine

## 2024-06-01 ENCOUNTER — Other Ambulatory Visit: Payer: Self-pay | Admitting: Neurology

## 2024-06-01 NOTE — Telephone Encounter (Signed)
 Last seen 04/07/24 Follow up scheduled on 11/04/24   Dispensed Days Supply Quantity Provider Pharmacy  GABAPENTIN  100 MG CAPSULE 05/04/2024 30 180 each Sater, Charlie LABOR, MD [facility] [phone] [facility] - .    Rx sent.

## 2024-07-01 ENCOUNTER — Other Ambulatory Visit (INDEPENDENT_AMBULATORY_CARE_PROVIDER_SITE_OTHER): Payer: Self-pay

## 2024-07-01 ENCOUNTER — Telehealth: Payer: Self-pay

## 2024-07-01 ENCOUNTER — Other Ambulatory Visit: Payer: Self-pay | Admitting: *Deleted

## 2024-07-01 DIAGNOSIS — Z79899 Other long term (current) drug therapy: Secondary | ICD-10-CM

## 2024-07-01 DIAGNOSIS — G35 Multiple sclerosis: Secondary | ICD-10-CM

## 2024-07-01 DIAGNOSIS — Z0289 Encounter for other administrative examinations: Secondary | ICD-10-CM

## 2024-07-01 NOTE — Telephone Encounter (Signed)
 Placed JCV in [facility] on 07/01/2024

## 2024-07-02 ENCOUNTER — Ambulatory Visit: Payer: Self-pay | Admitting: Neurology

## 2024-07-02 LAB — CBC WITH DIFFERENTIAL/PLATELET
Basophils Absolute: 0.1 x10E3/uL (ref 0.0–0.2)
Basos: 1 %
EOS (ABSOLUTE): 0.3 x10E3/uL (ref 0.0–0.4)
Eos: 5 %
Hematocrit: 43.5 % (ref 34.0–46.6)
Hemoglobin: 13.3 g/dL (ref 11.1–15.9)
Immature Grans (Abs): 0 x10E3/uL (ref 0.0–0.1)
Immature Granulocytes: 0 %
Lymphocytes Absolute: 2.4 x10E3/uL (ref 0.7–3.1)
Lymphs: 44 %
MCH: 25.5 pg — ABNORMAL LOW (ref 26.6–33.0)
MCHC: 30.6 g/dL — ABNORMAL LOW (ref 31.5–35.7)
MCV: 83 fL (ref 79–97)
Monocytes Absolute: 0.5 x10E3/uL (ref 0.1–0.9)
Monocytes: 9 %
Neutrophils Absolute: 2.2 x10E3/uL (ref 1.4–7.0)
Neutrophils: 41 %
Platelets: 226 x10E3/uL (ref 150–450)
RBC: 5.22 x10E6/uL (ref 3.77–5.28)
RDW: 14.1 % (ref 11.7–15.4)
WBC: 5.4 x10E3/uL (ref 3.4–10.8)

## 2024-07-12 NOTE — Telephone Encounter (Signed)
 SABRA

## 2024-08-11 ENCOUNTER — Encounter: Payer: Self-pay | Admitting: Neurology

## 2024-11-01 ENCOUNTER — Encounter: Payer: Self-pay | Admitting: Neurology

## 2024-11-01 ENCOUNTER — Other Ambulatory Visit: Payer: Self-pay

## 2024-11-01 MED ORDER — SOLIFENACIN SUCCINATE 5 MG PO TABS: 5.0000 mg | ORAL_TABLET | Freq: Every day | ORAL | 1 refills | Status: AC

## 2024-11-01 MED ORDER — SOLIFENACIN SUCCINATE 5 MG PO TABS: 5.0000 mg | ORAL_TABLET | Freq: Every day | ORAL | 1 refills | Status: DC

## 2024-11-04 ENCOUNTER — Ambulatory Visit: Admitting: Neurology

## 2024-11-04 ENCOUNTER — Encounter: Payer: Self-pay | Admitting: Neurology

## 2024-11-04 VITALS — Ht 68.0 in | Wt 308.5 lb

## 2024-11-04 DIAGNOSIS — G4489 Other headache syndrome: Secondary | ICD-10-CM | POA: Diagnosis not present

## 2024-11-04 DIAGNOSIS — Z79899 Other long term (current) drug therapy: Secondary | ICD-10-CM | POA: Diagnosis not present

## 2024-11-04 DIAGNOSIS — G35A Relapsing-remitting multiple sclerosis: Secondary | ICD-10-CM | POA: Diagnosis not present

## 2024-11-04 DIAGNOSIS — R200 Anesthesia of skin: Secondary | ICD-10-CM

## 2024-11-04 DIAGNOSIS — N3941 Urge incontinence: Secondary | ICD-10-CM | POA: Diagnosis not present

## 2024-11-04 DIAGNOSIS — R202 Paresthesia of skin: Secondary | ICD-10-CM

## 2024-11-04 MED ORDER — GABAPENTIN 100 MG PO CAPS: 100.0000 mg | ORAL_CAPSULE | Freq: Every day | ORAL | 11 refills | Status: AC | PRN

## 2024-11-04 NOTE — Progress Notes (Signed)
 "  [facility]  PATIENT: Nancy Jennings DOB: 08-25-1981  REFERRING DOCTOR OR PCP:  Almarie Brantley Pizza, NP (PCP); Dr. Lane (Neurology) SOURCE: Patient, notes from Dr. Lane, imaging and laboratory reports,  _________________________________   HISTORICAL  CHIEF COMPLAINT:  Chief Complaint  Patient presents with   RM1/MS    Pt is here Alone. Pt states she has been doing really good since her last appointment.     HISTORY OF PRESENT ILLNESS:  Nancy Jennings is a 44 year old woman with relapsing remitting multiple sclerosis.  Update 11/04/2024: She is on Tysabri .   JCV Ab is negative 0.25 (07/09/2024)   She tolerates it well and has not had any definite exacerbations.  MRIs in 04/30/23 of brain nad cervical spine showed no new lesions.    MS is very stable and has no exacerbation or new symptoms..   She could easily walk 2-3 miles.   She keeps up with others.  Though balance is good, she usually uses the bannister downstairs.    Strength is normal.   Dysaesthetitic pain in the arms, is less frequent and less intense. .    Gabapentin  200-300 (sometimes 200-400 mg)  has helped the dysesthesias.       Dysphagia resolved with OTC Prilosec (she also had GERD and that was likely etiology).   Her voice is fine.     She has urinary urgency with rare urge incontinence much better on Vesicare .  Less dry mouth than oxybutynin .    Migraines are doing very well.  She has occasional milder ones and will take an NSAID.   She is on nortriptyline  and zonisamide  for prophylaxis.    She tried stopping zonisamide  in the past but HA returned so she restarted it.  She has not taken sumatriptan  in about a year.  She notes fatigue.   She sleeps well most nights.     MS History: She was diagnosed with MS in late 2019.  In September 2019, she had the onset of left neck pain and then had numbness and pain that went down the left arm and then right arm and then right leg.   Symptoms evolved over 1-2 days.   Due to discomfort, she had trouble sleeping.   She saw her PCP a few days later.   She was referred her to [facility] and saw Dr. Lane.   Her symptoms had already resolved over 2-3 weeks but she continued to have painful tingling in the left arm that persisted.   She saw Dr. Lane in October and had a NCV/EMG in [place].     Due to continued symptoms, she had a brain MRI.    It was consistent with MS and an MRI of the cervical spine was also ordered.   It showed a lesion to the left at C2 and a second lesion ventral midline at C5C6.   Her MRIs were done at [facility].  She was started on  Tysabri .   She is JCV Ab negative.    MRI 1/102/20:  Cervical spinal cord: Ovoid, slightly expansile T2-hyperintense lesion in the left side of the spinal cord at the C2 vertebral level, measuring approximately 13 mm craniocaudal. Similar smaller, more subtle lesion in the ventral midline spinal cord at C6  MRI 10/16/2018 Brain: Multiple (greater than 10) T2/FLAIR-hyperintense white matter lesions in both cerebral hemispheres, both periventricular and juxtacortical, as well as, to a lesser extent, in both cerebellar hemispheres.  The brain volume  is normal.   Incidental right frontal developmental venous anomaly.  MRI 10/18/2020 showed multiple T2/FLAIR hyperintense foci in the hemispheres and also a focus in the left cerebellar hemisphere and in the spinal cord.  These are consistent with chronic demyelinating plaque associated with multiple sclerosis.  None of the foci appears to be acute.  However, one focus in the left frontal juxtacortical white matter was not present on the previous MRI and likely occurred during the 1 year interval.  MRI 12/26/2020 showed  multiple T2/FLAIR hyperintense foci in the cerebral hemispheres and left cerebellar hemisphere in a pattern and configuration consistent with chronic demyelinating plaque associated with multiple sclerosis.   None of the foci appear to be acute.  Compared to the MRI from 10/19/2019, there are no new lesions.  MRI cervical spine 12/26/2020  showed foci within the spinal cord to the left adjacent to C2 and centrally adjacent to C5-C6.  Possible very small focus posterolaterally to the left adjacent to C6.  No definite changes compared to the 10/30/2018 MRI.  MRi brain 04/30/23 was unchanged  MRI cervical spine 04/30/2023 was unchanged    REVIEW OF SYSTEMS: Constitutional: No fevers, chills, sweats, or change in appetite.  She notes fatigue. Eyes: No visual changes, double vision, eye pain Ear, nose and throat: No hearing loss, ear pain, nasal congestion, sore throat Cardiovascular: No chest pain, palpitations Respiratory:  No shortness of breath at rest or with exertion.   No wheezes GastrointestinaI: No nausea, vomiting, diarrhea, abdominal pain, fecal incontinence Genitourinary:  No dysuria, urinary retention or frequency.  No nocturia. Musculoskeletal:  No neck pain, back pain Integumentary: No rash, pruritus, skin lesions Neurological: as above Psychiatric: No depression at this time.  No anxiety Endocrine: No palpitations, diaphoresis, change in appetite, change in weigh or increased thirst Hematologic/Lymphatic:  No anemia, purpura, petechiae. Allergic/Immunologic: No itchy/runny eyes, nasal congestion, recent allergic reactions, rashes  ALLERGIES: No Known Allergies  HOME MEDICATIONS:  Current Outpatient Medications:    cetirizine (ZYRTEC) 10 MG tablet, Take 10 mg by mouth daily., Disp: , Rfl:    Cholecalciferol (VITAMIN D3) 50 MCG (2000 UT) capsule, Take 2,000 Units by mouth daily. , Disp: , Rfl:    gabapentin  (NEURONTIN ) 100 MG capsule, TAKE 1-6 CAPSULES (100-600 MG TOTAL) BY MOUTH DAILY AS NEEDED., Disp: 180 capsule, Rfl: 5   Ibuprofen  (ADVIL  PO), Take 1 Dose by mouth as needed., Disp: , Rfl:    Magnesium Glycinate 100 MG CAPS, Take 200 mg by mouth 2 (two) times daily., Disp: , Rfl:     Multiple Vitamin (MULTI-VITAMIN) tablet, Take 1 tablet by mouth daily., Disp: , Rfl:    natalizumab  (TYSABRI ) 300 MG/15ML injection, Inject 300 mg into the vein every 28 (twenty-eight) days., Disp: , Rfl:    nortriptyline  (PAMELOR ) 10 MG capsule, TAKE 2 TO 3 CAPSULES BY MOUTH EVERY NIGHT AT BEDTIME, Disp: 270 capsule, Rfl: 3   solifenacin  (VESICARE ) 5 MG tablet, Take 1 tablet (5 mg total) by mouth daily., Disp: 90 tablet, Rfl: 1   SUMAtriptan  (IMITREX ) 100 MG tablet, Take 1 tablet (100 mg total) by mouth once as needed for up to 1 dose for migraine. May repeat in 2 hours if headache persists or recurs., Disp: 10 tablet, Rfl: 11   zonisamide  (ZONEGRAN ) 100 MG capsule, TAKE 1 CAPSULE(100 MG) BY MOUTH DAILY, Disp: 90 capsule, Rfl: 3   Loratadine 10 MG CAPS, Take by mouth. (Patient not taking: Reported on 11/04/2024), Disp: , Rfl:    magnesium gluconate (  MAGONATE) 500 MG tablet, Take 500 mg by mouth at bedtime. (Patient not taking: Reported on 11/04/2024), Disp: , Rfl:   PAST MEDICAL HISTORY: Past Medical History:  Diagnosis Date   Multiple sclerosis    Neuromuscular disorder (HCC)     PAST SURGICAL HISTORY: Past Surgical History:  Procedure Laterality Date   DILATION AND CURETTAGE OF UTERUS     FRACTURE SURGERY  02/2018   left ankle    FAMILY HISTORY: Family History  Problem Relation Age of Onset   Breast cancer Mother 70    SOCIAL HISTORY:  Social History   Socioeconomic History   Marital status: Married    Spouse name: Adriana   Number of children: Not on file   Years of education: Not on file   Highest education level: Not on file  Occupational History   Occupation: [employer]   Tobacco Use   Smoking status: Never   Smokeless tobacco: Never  Vaping Use   Vaping status: Never Used  Substance and Sexual Activity   Alcohol use: Yes    Alcohol/week: 1.0 standard drink of alcohol    Types: 1 Shots of liquor per week    Comment: rarely - about 1x a month   Drug use:  Never   Sexual activity: Not on file  Other Topics Concern   Not on file  Social History Narrative   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   Right handed    Lives with husband   Social Drivers of Health   Tobacco Use: Low Risk (11/04/2024)   Patient History    Smoking Tobacco Use: Never    Smokeless Tobacco Use: Never    Passive Exposure: Not on file  Financial Resource Strain: Low Risk  (03/01/2024)   Received from [facility]   Overall Financial Resource Strain (CARDIA)    Difficulty of Paying Living Expenses: Not very hard  Food Insecurity: No Food Insecurity (03/01/2024)   Received from [facility]   Epic    Within the past 12 months, you worried that your food would run out before you got the money to buy more.: Never true    Within the past 12 months, the food you bought just didn't last and you didn't have money to get more.: Never true  Transportation Needs: No Transportation Needs (03/01/2024)   Received from [facility] - Transportation    In the past 12 months, has lack of transportation kept you from medical appointments or from getting medications?: No    Lack of Transportation (Non-Medical): No  Physical Activity: Not on file  Stress: Not on file  Social Connections: Not on file  Intimate Partner Violence: Not on file  Depression (EYV7-0): Not on file  Alcohol Screen: Not on file  Housing: Low Risk  (03/01/2024)   Received from [facility]   Epic    In the last 12 months, was there a time when you were not able to pay the mortgage or rent on time?: No    In the past 12 months, how many times have you moved where you were living?: 0    At any time in the past 12 months, were you homeless or living in a shelter (including now)?: No  Utilities: Not At Risk (03/01/2024)   Received from [facility] Utilities    Threatened with loss of utilities: No  Health Literacy: Not on file  PHYSICAL EXAM  Vitals:   11/04/24 0828  Weight: (!) 308 lb 8 oz (139.9 kg)  Height: 5' 8 (1.727 m)   REPEAT 155/95  Body mass index is 46.91 kg/m.   General: The patient is well-developed and well-nourished and in no acute distress.  Neck has good range of motion.  HEENT:  Head is [place]/AT.  Sclera are anicteric.      Skin: Extremities are without rash or  edema.  Neurologic Exam  Mental status: The patient is alert and oriented x 3 at the time of the examination. The patient has apparent normal recent and remote memory, with an apparently normal attention span and concentration ability.   Speech is normal.  Cranial nerves: Extraocular movements are full.  .  Facial symmetry is present. There is good facial sensation to soft touch bilaterally.Facial strength is normal.  Trapezius and sternocleidomastoid strength is normal. No dysarthria is noted.  No obvious hearing deficits are noted.  Motor:  Muscle bulk is normal.   Tone is normal. Strength is  5 / 5 in all 4 extremities.   Sensory: On sensory exam, she has symmetric sensation to touch and vibration today  Coordination: Cerebellar testing reveals good finger-nose-finger and heel-to-shin bilaterally.  Gait and station: Station is normal.  Gait is normal.  The tandem gait is normal today.  Romberg is negative.  Reflexes: Deep tendon reflexes are symmetric and normal bilaterally.      DIAGNOSTIC DATA (LABS, IMAGING, TESTING) - I reviewed patient records, labs, notes, testing and imaging myself where available.  Lab Results  Component Value Date   WBC 5.4 07/01/2024   HGB 13.3 07/01/2024   HCT 43.5 07/01/2024   MCV 83 07/01/2024   PLT 226 07/01/2024       ASSESSMENT AND PLAN  1. Relapsing remitting multiple sclerosis   2. High risk medication use   3. Numbness and tingling in left arm   4. Other headache syndrome   5. Urge incontinence     1.   Continue Tysabri .   Check JCV Ab q 6 months.   2.    Continue  gabapentin  200 mg po tid  3.   For migraines:    continue on Zonisamide , consider d/c nortriptyline  to prn.  Continue sumatriptan  prn.    4.   Vesicare  5 mg  for urinary dysfunction 5.   Stay active and exercise,  We discussed weight loss 6.   RTC 6 months or sooner if new or worsening    Aurthur Wingerter A. Vear, MD, PhD, FAAN Certified in Neurology, Clinical Neurophysiology, Sleep Medicine, Pain Medicine and Neuroimaging Director, [facility]  [facility] [address], [address] [place], [place] [address] [phone]  Addendum:  "

## 2025-05-16 ENCOUNTER — Ambulatory Visit: Admitting: Neurology
